# Patient Record
Sex: Female | Born: 1940
Health system: Southern US, Community
[De-identification: ages and names within clinical notes are randomized; demographics above are authoritative.]

## PROBLEM LIST (undated history)

## (undated) DIAGNOSIS — E039 Hypothyroidism, unspecified: Secondary | ICD-10-CM

## (undated) DIAGNOSIS — M109 Gout, unspecified: Secondary | ICD-10-CM

## (undated) DIAGNOSIS — E7800 Pure hypercholesterolemia, unspecified: Secondary | ICD-10-CM

## (undated) DIAGNOSIS — G4733 Obstructive sleep apnea (adult) (pediatric): Secondary | ICD-10-CM

## (undated) DIAGNOSIS — I10 Essential (primary) hypertension: Secondary | ICD-10-CM

## (undated) HISTORY — DX: Obstructive sleep apnea (adult) (pediatric): G47.33

---

## 1998-12-22 ENCOUNTER — Other Ambulatory Visit: Admission: RE | Admit: 1998-12-22 | Discharge: 1998-12-22 | Payer: Self-pay | Admitting: *Deleted

## 1999-08-20 ENCOUNTER — Emergency Department (HOSPITAL_COMMUNITY): Admission: EM | Admit: 1999-08-20 | Discharge: 1999-08-20 | Payer: Self-pay | Admitting: Emergency Medicine

## 1999-12-20 ENCOUNTER — Encounter: Payer: Self-pay | Admitting: *Deleted

## 1999-12-20 ENCOUNTER — Encounter: Admission: RE | Admit: 1999-12-20 | Discharge: 1999-12-20 | Payer: Self-pay | Admitting: *Deleted

## 2000-01-18 ENCOUNTER — Other Ambulatory Visit: Admission: RE | Admit: 2000-01-18 | Discharge: 2000-01-18 | Payer: Self-pay | Admitting: *Deleted

## 2000-12-21 ENCOUNTER — Encounter: Payer: Self-pay | Admitting: *Deleted

## 2000-12-21 ENCOUNTER — Encounter: Admission: RE | Admit: 2000-12-21 | Discharge: 2000-12-21 | Payer: Self-pay | Admitting: *Deleted

## 2000-12-25 ENCOUNTER — Encounter: Admission: RE | Admit: 2000-12-25 | Discharge: 2000-12-25 | Payer: Self-pay | Admitting: *Deleted

## 2000-12-25 ENCOUNTER — Encounter: Payer: Self-pay | Admitting: *Deleted

## 2002-01-07 ENCOUNTER — Encounter: Payer: Self-pay | Admitting: *Deleted

## 2002-01-07 ENCOUNTER — Encounter: Admission: RE | Admit: 2002-01-07 | Discharge: 2002-01-07 | Payer: Self-pay | Admitting: *Deleted

## 2002-01-24 ENCOUNTER — Other Ambulatory Visit: Admission: RE | Admit: 2002-01-24 | Discharge: 2002-01-24 | Payer: Self-pay | Admitting: *Deleted

## 2003-01-09 ENCOUNTER — Encounter: Admission: RE | Admit: 2003-01-09 | Discharge: 2003-01-09 | Payer: Self-pay | Admitting: *Deleted

## 2003-01-09 ENCOUNTER — Encounter: Payer: Self-pay | Admitting: *Deleted

## 2003-02-03 ENCOUNTER — Other Ambulatory Visit: Admission: RE | Admit: 2003-02-03 | Discharge: 2003-02-03 | Payer: Self-pay | Admitting: *Deleted

## 2004-01-12 ENCOUNTER — Encounter: Admission: RE | Admit: 2004-01-12 | Discharge: 2004-01-12 | Payer: Self-pay | Admitting: *Deleted

## 2004-02-03 ENCOUNTER — Other Ambulatory Visit: Admission: RE | Admit: 2004-02-03 | Discharge: 2004-02-03 | Payer: Self-pay | Admitting: *Deleted

## 2005-01-17 ENCOUNTER — Encounter: Admission: RE | Admit: 2005-01-17 | Discharge: 2005-01-17 | Payer: Self-pay | Admitting: *Deleted

## 2005-02-02 ENCOUNTER — Other Ambulatory Visit: Admission: RE | Admit: 2005-02-02 | Discharge: 2005-02-02 | Payer: Self-pay | Admitting: *Deleted

## 2005-09-21 ENCOUNTER — Ambulatory Visit (HOSPITAL_COMMUNITY): Admission: RE | Admit: 2005-09-21 | Discharge: 2005-09-21 | Payer: Self-pay | Admitting: *Deleted

## 2006-01-19 ENCOUNTER — Encounter: Admission: RE | Admit: 2006-01-19 | Discharge: 2006-01-19 | Payer: Self-pay | Admitting: *Deleted

## 2006-02-21 ENCOUNTER — Other Ambulatory Visit: Admission: RE | Admit: 2006-02-21 | Discharge: 2006-02-21 | Payer: Self-pay | Admitting: Gynecology

## 2007-02-12 ENCOUNTER — Encounter: Admission: RE | Admit: 2007-02-12 | Discharge: 2007-02-12 | Payer: Self-pay | Admitting: *Deleted

## 2007-03-12 ENCOUNTER — Other Ambulatory Visit: Admission: RE | Admit: 2007-03-12 | Discharge: 2007-03-12 | Payer: Self-pay | Admitting: *Deleted

## 2008-02-19 ENCOUNTER — Encounter: Admission: RE | Admit: 2008-02-19 | Discharge: 2008-02-19 | Payer: Self-pay | Admitting: Internal Medicine

## 2008-02-27 ENCOUNTER — Encounter: Admission: RE | Admit: 2008-02-27 | Discharge: 2008-02-27 | Payer: Self-pay | Admitting: Internal Medicine

## 2008-05-06 ENCOUNTER — Other Ambulatory Visit: Admission: RE | Admit: 2008-05-06 | Discharge: 2008-05-06 | Payer: Self-pay | Admitting: Gynecology

## 2009-02-26 ENCOUNTER — Encounter: Admission: RE | Admit: 2009-02-26 | Discharge: 2009-02-26 | Payer: Self-pay | Admitting: Internal Medicine

## 2009-03-03 ENCOUNTER — Encounter: Admission: RE | Admit: 2009-03-03 | Discharge: 2009-03-03 | Payer: Self-pay | Admitting: Internal Medicine

## 2010-03-17 ENCOUNTER — Encounter: Admission: RE | Admit: 2010-03-17 | Discharge: 2010-03-17 | Payer: Self-pay | Admitting: Internal Medicine

## 2010-08-08 ENCOUNTER — Encounter: Payer: Self-pay | Admitting: Internal Medicine

## 2010-12-03 NOTE — Op Note (Signed)
[name], [name]                 ACCOUNT NO.:  [id number]   MEDICAL RECORD NO.:  [id number]          PATIENT TYPE:  AMB   LOCATION:  ENDO                         FACILITY:  [facility]   PHYSICIAN:  Waverly Ferrari, M.D.    DATE OF BIRTH:  12-26-1940   DATE OF PROCEDURE:  09/21/2005  DATE OF DISCHARGE:                                 OPERATIVE REPORT   PROCEDURE:  Upper endoscopy.   INDICATIONS:  GERD.   ANESTHESIA:  Demerol 50 mg, Versed 5 mg.   PROCEDURE:  With the patient mildly sedated in the left lateral decubitus  position, the Olympus videoscopic endoscope was inserted in the mouth,  passed under direct vision through the esophagus, which appeared normal,  into the stomach.  The fundus, body, antrum, duodenal bulb and second  portion of the duodenum were visualized.  From this point the endoscope was  slowly withdrawn taking circumferential views of the duodenal mucosa until  the endoscope had been pulled back in the stomach, placed in retroflexion to  view the stomach from below, and the scope was straightened and withdrawn,  taking circumferential views of the remaining gastric and esophageal mucosa.  The patient's vital signs and pulse oximetry remained stable.  The patient  tolerated the procedure well without apparent complications.   FINDINGS:  Rather unremarkable examination.   PLAN:  Proceed to colonoscopy.           ______________________________  Waverly Ferrari, M.D.     GMO/MEDQ  D:  09/21/2005  T:  09/22/2005  Job:  [id number]

## 2010-12-03 NOTE — Op Note (Signed)
Brittany [name], Brittany [name]                 ACCOUNT NO.:  [id number]   MEDICAL RECORD NO.:  [id number]          PATIENT TYPE:  AMB   LOCATION:  ENDO                         FACILITY:  [facility]   PHYSICIAN:  Waverly Ferrari, M.D.    DATE OF BIRTH:  [birth year]   DATE OF PROCEDURE:  09/21/2005  DATE OF DISCHARGE:                                 OPERATIVE REPORT   PROCEDURE:  Colonoscopy.   INDICATIONS:  Colon polyps.   ANESTHESIA:  Demerol 20 mg, Versed 1 mg.   PROCEDURE:  With the patient mildly sedated in the left lateral decubitus  position, the Olympus videoscopic colonoscope was inserted to the rectum,  passed under direct vision to the cecum, identified by base of cecum and  ileocecal valve, both of which were photographed.  From this point the  colonoscope was slowly withdrawn taking circumferential views of colonic  mucosa, stopping only in the rectum, which appeared normal on direct and  showed hemorrhoids on retroflexed view.  The endoscope was straightened and  withdrawn.  The patient's vital signs, pulse oximeter remained stable.  The  patient tolerated the procedure well without apparent complications.   FINDINGS:  Internal hemorrhoids, otherwise an unremarkable colonoscopic  examination to the cecum.   PLAN:  Repeat examination possibly in five years if indicated.           ______________________________  Waverly Ferrari, M.D.     GMO/MEDQ  D:  09/21/2005  T:  09/22/2005  Job:  [id number]

## 2011-03-10 ENCOUNTER — Other Ambulatory Visit: Payer: Self-pay | Admitting: Internal Medicine

## 2011-03-10 DIAGNOSIS — Z1231 Encounter for screening mammogram for malignant neoplasm of breast: Secondary | ICD-10-CM

## 2011-03-24 ENCOUNTER — Ambulatory Visit
Admission: RE | Admit: 2011-03-24 | Discharge: 2011-03-24 | Disposition: A | Discharge status: Home / Self Care | Payer: Medicare Other | Source: Ambulatory Visit | Attending: Internal Medicine | Admitting: Internal Medicine

## 2011-03-24 DIAGNOSIS — Z1231 Encounter for screening mammogram for malignant neoplasm of breast: Secondary | ICD-10-CM

## 2011-11-08 ENCOUNTER — Other Ambulatory Visit: Payer: Self-pay | Admitting: Internal Medicine

## 2011-11-08 ENCOUNTER — Ambulatory Visit
Admission: RE | Admit: 2011-11-08 | Discharge: 2011-11-08 | Disposition: A | Discharge status: Home / Self Care | Payer: Medicare Other | Source: Ambulatory Visit | Attending: Internal Medicine | Admitting: Internal Medicine

## 2011-11-08 DIAGNOSIS — R000 Tachycardia, unspecified: Secondary | ICD-10-CM

## 2011-11-08 DIAGNOSIS — R9389 Abnormal findings on diagnostic imaging of other specified body structures: Secondary | ICD-10-CM

## 2011-11-08 MED ORDER — IOHEXOL 350 MG/ML SOLN
125.0000 mL | Freq: Once | INTRAVENOUS | Status: AC | PRN
  Administered 2011-11-08: 125 mL via INTRAVENOUS

## 2012-03-26 ENCOUNTER — Other Ambulatory Visit: Payer: Self-pay | Admitting: Internal Medicine

## 2012-03-26 DIAGNOSIS — Z1231 Encounter for screening mammogram for malignant neoplasm of breast: Secondary | ICD-10-CM

## 2012-04-10 ENCOUNTER — Ambulatory Visit
Admission: RE | Admit: 2012-04-10 | Discharge: 2012-04-10 | Disposition: A | Discharge status: Home / Self Care | Payer: Medicare Other | Source: Ambulatory Visit | Attending: Internal Medicine | Admitting: Internal Medicine

## 2012-04-10 DIAGNOSIS — Z1231 Encounter for screening mammogram for malignant neoplasm of breast: Secondary | ICD-10-CM

## 2013-03-26 ENCOUNTER — Other Ambulatory Visit: Payer: Self-pay

## 2013-03-26 DIAGNOSIS — Z1231 Encounter for screening mammogram for malignant neoplasm of breast: Secondary | ICD-10-CM

## 2013-04-18 ENCOUNTER — Ambulatory Visit
Admission: RE | Admit: 2013-04-18 | Discharge: 2013-04-18 | Disposition: A | Discharge status: Home / Self Care | Payer: Medicare Other | Source: Ambulatory Visit

## 2013-04-18 DIAGNOSIS — Z1231 Encounter for screening mammogram for malignant neoplasm of breast: Secondary | ICD-10-CM

## 2014-03-25 ENCOUNTER — Other Ambulatory Visit: Payer: Self-pay

## 2014-03-25 DIAGNOSIS — Z1231 Encounter for screening mammogram for malignant neoplasm of breast: Secondary | ICD-10-CM

## 2014-04-25 ENCOUNTER — Ambulatory Visit
Admission: RE | Admit: 2014-04-25 | Discharge: 2014-04-25 | Disposition: A | Discharge status: Home / Self Care | Payer: Medicare Other | Source: Ambulatory Visit

## 2014-04-25 DIAGNOSIS — Z1231 Encounter for screening mammogram for malignant neoplasm of breast: Secondary | ICD-10-CM

## 2014-08-05 DIAGNOSIS — M1000 Idiopathic gout, unspecified site: Secondary | ICD-10-CM | POA: Diagnosis not present

## 2014-08-05 DIAGNOSIS — I10 Essential (primary) hypertension: Secondary | ICD-10-CM | POA: Diagnosis not present

## 2014-08-12 DIAGNOSIS — L501 Idiopathic urticaria: Secondary | ICD-10-CM | POA: Diagnosis not present

## 2014-08-12 DIAGNOSIS — I10 Essential (primary) hypertension: Secondary | ICD-10-CM | POA: Diagnosis not present

## 2014-08-12 DIAGNOSIS — H539 Unspecified visual disturbance: Secondary | ICD-10-CM | POA: Diagnosis not present

## 2014-08-12 DIAGNOSIS — E782 Mixed hyperlipidemia: Secondary | ICD-10-CM | POA: Diagnosis not present

## 2014-11-12 DIAGNOSIS — L501 Idiopathic urticaria: Secondary | ICD-10-CM | POA: Diagnosis not present

## 2014-11-12 DIAGNOSIS — J300 Vasomotor rhinitis: Secondary | ICD-10-CM | POA: Diagnosis not present

## 2014-11-12 DIAGNOSIS — J309 Allergic rhinitis, unspecified: Secondary | ICD-10-CM | POA: Diagnosis not present

## 2015-01-05 DIAGNOSIS — H1013 Acute atopic conjunctivitis, bilateral: Secondary | ICD-10-CM | POA: Diagnosis not present

## 2015-01-12 DIAGNOSIS — M25569 Pain in unspecified knee: Secondary | ICD-10-CM | POA: Diagnosis not present

## 2015-01-22 DIAGNOSIS — M25569 Pain in unspecified knee: Secondary | ICD-10-CM | POA: Diagnosis not present

## 2015-01-28 DIAGNOSIS — E782 Mixed hyperlipidemia: Secondary | ICD-10-CM | POA: Diagnosis not present

## 2015-01-28 DIAGNOSIS — I10 Essential (primary) hypertension: Secondary | ICD-10-CM | POA: Diagnosis not present

## 2015-01-28 DIAGNOSIS — M150 Primary generalized (osteo)arthritis: Secondary | ICD-10-CM | POA: Diagnosis not present

## 2015-02-17 DIAGNOSIS — Z0000 Encounter for general adult medical examination without abnormal findings: Secondary | ICD-10-CM | POA: Diagnosis not present

## 2015-02-17 DIAGNOSIS — I10 Essential (primary) hypertension: Secondary | ICD-10-CM | POA: Diagnosis not present

## 2015-02-17 DIAGNOSIS — E782 Mixed hyperlipidemia: Secondary | ICD-10-CM | POA: Diagnosis not present

## 2015-02-17 DIAGNOSIS — Z780 Asymptomatic menopausal state: Secondary | ICD-10-CM | POA: Diagnosis not present

## 2015-02-17 DIAGNOSIS — E039 Hypothyroidism, unspecified: Secondary | ICD-10-CM | POA: Diagnosis not present

## 2015-03-12 ENCOUNTER — Other Ambulatory Visit: Payer: Self-pay

## 2015-03-12 DIAGNOSIS — Z1231 Encounter for screening mammogram for malignant neoplasm of breast: Secondary | ICD-10-CM

## 2015-04-01 DIAGNOSIS — Z23 Encounter for immunization: Secondary | ICD-10-CM | POA: Diagnosis not present

## 2015-04-27 ENCOUNTER — Ambulatory Visit: Payer: Self-pay

## 2015-05-04 ENCOUNTER — Ambulatory Visit
Admission: RE | Admit: 2015-05-04 | Discharge: 2015-05-04 | Disposition: A | Discharge status: Home / Self Care | Payer: Medicare Other | Source: Ambulatory Visit

## 2015-05-04 DIAGNOSIS — Z1231 Encounter for screening mammogram for malignant neoplasm of breast: Secondary | ICD-10-CM

## 2015-05-11 DIAGNOSIS — L501 Idiopathic urticaria: Secondary | ICD-10-CM | POA: Diagnosis not present

## 2015-05-12 DIAGNOSIS — F411 Generalized anxiety disorder: Secondary | ICD-10-CM | POA: Diagnosis not present

## 2015-05-12 DIAGNOSIS — L501 Idiopathic urticaria: Secondary | ICD-10-CM | POA: Diagnosis not present

## 2015-05-18 DIAGNOSIS — Z6837 Body mass index (BMI) 37.0-37.9, adult: Secondary | ICD-10-CM | POA: Diagnosis not present

## 2015-05-18 DIAGNOSIS — Z124 Encounter for screening for malignant neoplasm of cervix: Secondary | ICD-10-CM | POA: Diagnosis not present

## 2015-05-18 DIAGNOSIS — Z01419 Encounter for gynecological examination (general) (routine) without abnormal findings: Secondary | ICD-10-CM | POA: Diagnosis not present

## 2015-06-16 DIAGNOSIS — Z23 Encounter for immunization: Secondary | ICD-10-CM | POA: Diagnosis not present

## 2015-06-16 DIAGNOSIS — L501 Idiopathic urticaria: Secondary | ICD-10-CM | POA: Diagnosis not present

## 2015-06-17 DIAGNOSIS — J309 Allergic rhinitis, unspecified: Secondary | ICD-10-CM | POA: Diagnosis not present

## 2015-06-17 DIAGNOSIS — J300 Vasomotor rhinitis: Secondary | ICD-10-CM | POA: Diagnosis not present

## 2015-06-17 DIAGNOSIS — L501 Idiopathic urticaria: Secondary | ICD-10-CM | POA: Diagnosis not present

## 2015-08-11 DIAGNOSIS — M1A0790 Idiopathic chronic gout, unspecified ankle and foot, without tophus (tophi): Secondary | ICD-10-CM | POA: Diagnosis not present

## 2015-08-11 DIAGNOSIS — I10 Essential (primary) hypertension: Secondary | ICD-10-CM | POA: Diagnosis not present

## 2015-08-11 DIAGNOSIS — E782 Mixed hyperlipidemia: Secondary | ICD-10-CM | POA: Diagnosis not present

## 2015-08-18 DIAGNOSIS — I10 Essential (primary) hypertension: Secondary | ICD-10-CM | POA: Diagnosis not present

## 2015-08-18 DIAGNOSIS — E890 Postprocedural hypothyroidism: Secondary | ICD-10-CM | POA: Diagnosis not present

## 2015-08-18 DIAGNOSIS — E039 Hypothyroidism, unspecified: Secondary | ICD-10-CM | POA: Diagnosis not present

## 2015-08-18 DIAGNOSIS — E782 Mixed hyperlipidemia: Secondary | ICD-10-CM | POA: Diagnosis not present

## 2015-08-18 DIAGNOSIS — L501 Idiopathic urticaria: Secondary | ICD-10-CM | POA: Diagnosis not present

## 2015-09-02 DIAGNOSIS — E890 Postprocedural hypothyroidism: Secondary | ICD-10-CM | POA: Diagnosis not present

## 2015-09-30 DIAGNOSIS — I10 Essential (primary) hypertension: Secondary | ICD-10-CM | POA: Diagnosis not present

## 2015-09-30 DIAGNOSIS — E890 Postprocedural hypothyroidism: Secondary | ICD-10-CM | POA: Diagnosis not present

## 2015-09-30 DIAGNOSIS — R21 Rash and other nonspecific skin eruption: Secondary | ICD-10-CM | POA: Diagnosis not present

## 2015-11-12 DIAGNOSIS — E890 Postprocedural hypothyroidism: Secondary | ICD-10-CM | POA: Diagnosis not present

## 2015-11-12 DIAGNOSIS — L501 Idiopathic urticaria: Secondary | ICD-10-CM | POA: Diagnosis not present

## 2015-11-12 DIAGNOSIS — I10 Essential (primary) hypertension: Secondary | ICD-10-CM | POA: Diagnosis not present

## 2015-11-12 DIAGNOSIS — E782 Mixed hyperlipidemia: Secondary | ICD-10-CM | POA: Diagnosis not present

## 2015-11-19 ENCOUNTER — Emergency Department (HOSPITAL_COMMUNITY)
Admission: EM | Admit: 2015-11-19 | Discharge: 2015-11-19 | Disposition: A | Discharge status: Home / Self Care | Payer: Medicare Other | Attending: Emergency Medicine | Admitting: Emergency Medicine

## 2015-11-19 ENCOUNTER — Encounter (HOSPITAL_COMMUNITY): Payer: Self-pay | Admitting: Emergency Medicine

## 2015-11-19 DIAGNOSIS — R21 Rash and other nonspecific skin eruption: Secondary | ICD-10-CM | POA: Diagnosis present

## 2015-11-19 DIAGNOSIS — E7800 Pure hypercholesterolemia, unspecified: Secondary | ICD-10-CM | POA: Diagnosis not present

## 2015-11-19 DIAGNOSIS — L509 Urticaria, unspecified: Secondary | ICD-10-CM | POA: Diagnosis not present

## 2015-11-19 DIAGNOSIS — E039 Hypothyroidism, unspecified: Secondary | ICD-10-CM | POA: Insufficient documentation

## 2015-11-19 DIAGNOSIS — Z7982 Long term (current) use of aspirin: Secondary | ICD-10-CM | POA: Diagnosis not present

## 2015-11-19 DIAGNOSIS — I10 Essential (primary) hypertension: Secondary | ICD-10-CM | POA: Insufficient documentation

## 2015-11-19 DIAGNOSIS — Z79899 Other long term (current) drug therapy: Secondary | ICD-10-CM | POA: Insufficient documentation

## 2015-11-19 HISTORY — DX: Hypothyroidism, unspecified: E03.9

## 2015-11-19 HISTORY — DX: Essential (primary) hypertension: I10

## 2015-11-19 HISTORY — DX: Pure hypercholesterolemia, unspecified: E78.00

## 2015-11-19 HISTORY — DX: Gout, unspecified: M10.9

## 2015-11-19 MED ORDER — PREDNISONE 20 MG PO TABS
60.0000 mg | ORAL_TABLET | Freq: Once | ORAL | Status: AC
  Administered 2015-11-19: 60 mg via ORAL
  Filled 2015-11-19: qty 3

## 2015-11-19 MED ORDER — PREDNISONE 20 MG PO TABS: 40.0000 mg | ORAL_TABLET | Freq: Every day | ORAL | Status: DC

## 2015-11-19 MED ORDER — EPINEPHRINE 0.3 MG/0.3ML IJ SOAJ: 0.3000 mg | Freq: Once | INTRAMUSCULAR | Status: AC

## 2015-11-19 NOTE — ED Notes (Signed)
Pt c/o SOB and worsening of chronic red rash onset this morning on waking. Pt reports raised red rash over body x several years but that today it worsened and she felt she could not take a full inspiration. No contact with new substances, lotions, creams, or foods.

## 2015-11-19 NOTE — ED Provider Notes (Addendum)
CSN: [id number]     Arrival date & time 11/19/15  0911 History   First MD Initiated Contact with Patient 11/19/15 0930     Chief Complaint  Patient presents with  . Shortness of Breath  . Rash     (Consider location/radiation/quality/duration/timing/severity/associated sxs/prior Treatment) Patient is a 75 y.o. female presenting with rash. The history is provided by the patient.  Rash Location:  Full body Quality: itchiness and redness   Severity:  Severe Onset quality:  Gradual Duration:  12 hours Timing:  Constant Progression:  Worsening Chronicity:  Chronic Relieved by:  Nothing Worsened by:  Nothing tried Ineffective treatments: allegra. Associated symptoms: no throat swelling, no tongue swelling and not wheezing   Associated symptoms comment:  States this morning when she was in the shower when the hives were really bad she felt like maybe she had to take deeper breaths but denies any throat itchiness, scratching and states currently her breathing is normal.   Past Medical History  Diagnosis Date  . Hypertension   . Hypothyroid   . Gout   . Hypercholesterolemia    History reviewed. No pertinent past surgical history. History reviewed. No pertinent family history. Social History  Substance Use Topics  . Smoking status: Former Research scientist (life sciences)  . Smokeless tobacco: None  . Alcohol Use: None   OB History    No data available     Review of Systems  Respiratory: Negative for wheezing.   Skin: Positive for rash.  All other systems reviewed and are negative.     Allergies  Review of patient's allergies indicates no known allergies.  Home Medications   Prior to Admission medications   Medication Sig Start Date End Date Taking? Authorizing Provider  allopurinol (ZYLOPRIM) 100 MG tablet Take 100 mg by mouth daily.   Yes Historical Provider, MD  aspirin EC 81 MG tablet Take 81 mg by mouth daily.   Yes Historical Provider, MD  atorvastatin (LIPITOR) 20 MG tablet Take 20  mg by mouth daily.   Yes Historical Provider, MD  colchicine 0.6 MG tablet Take 0.6 mg by mouth daily as needed (for gout flare ups).   Yes Historical Provider, MD  diltiazem (DILACOR XR) 120 MG 24 hr capsule Take 120 mg by mouth daily.   Yes Historical Provider, MD  diphenhydrAMINE (BENADRYL) 25 mg capsule Take 25 mg by mouth 2 (two) times daily as needed for itching.   Yes Historical Provider, MD  fexofenadine (ALLEGRA) 180 MG tablet Take 180 mg by mouth daily as needed for allergies.   Yes Historical Provider, MD  furosemide (LASIX) 40 MG tablet Take 40 mg by mouth daily.   Yes Historical Provider, MD  guanFACINE (TENEX) 1 MG tablet Take 2 mg by mouth at bedtime.   Yes Historical Provider, MD  hydrOXYzine (ATARAX/VISTARIL) 25 MG tablet Take 2 tablets by mouth 3 (three) times daily as needed for itching.  09/02/15  Yes Historical Provider, MD  levothyroxine (SYNTHROID, LEVOTHROID) 150 MCG tablet Take 150 mcg by mouth daily before breakfast.   Yes Historical Provider, MD  montelukast (SINGULAIR) 10 MG tablet Take 10 mg by mouth daily as needed (for alleriges).  10/28/15  Yes Historical Provider, MD   BP 136/80 mmHg  Pulse 87  Temp(Src)   Resp 16  SpO2 100% Physical Exam  Constitutional: She is oriented to person, place, and time. She appears well-developed and well-nourished. No distress.  HENT:  Head: Normocephalic and atraumatic.  Mouth/Throat: Oropharynx is clear and moist. No  posterior oropharyngeal edema or posterior oropharyngeal erythema.  Eyes: Conjunctivae and EOM are normal. Pupils are equal, round, and reactive to light.  Neck: Normal range of motion. Neck supple.  Cardiovascular: Normal rate, regular rhythm and intact distal pulses.   No murmur heard. Pulmonary/Chest: Effort normal and breath sounds normal. No respiratory distress. She has no wheezes. She has no rales.  Abdominal: Soft.  Musculoskeletal: Normal range of motion. She exhibits no edema or tenderness.   Neurological: She is alert and oriented to person, place, and time.  Skin: Skin is warm and dry. Rash noted. Rash is urticarial. No erythema.  Generalized urticarial rash over the entire body.  No mucous membrane involvement  Psychiatric: She has a normal mood and affect. Her behavior is normal.  Nursing note and vitals reviewed.   ED Course  Procedures (including critical care time) Labs Review Labs Reviewed - No data to display  Imaging Review No results found. I have personally reviewed and evaluated these images and lab results as part of my medical decision-making.   EKG Interpretation None      MDM   Final diagnoses:  Urticaria    Patient is a 75 year old female with a history of idiopathic hives for the last 3 years who states that a proximally 6 weeks ago she was put on a new blood pressure medication and her hives. For a month however now they started to come back. She did stop taking a lot of her allergy medication however now the hives are the worst they've ever been. She did take it later today and an Allegra but states the hives are still very bothersome. When she was in the shower she thought she felt a little short of breath but that has resolved and she denies any scratchiness of her throat, itching in her mouth or swelling. Patient's oxygen saturation is 100%. Patient given prednisone here and instructed to start back all of her antihistamine medications. She has an allergist and an endocrinologist that she can follow-up with. She was given a Dosepak of prednisone. She was also prescribed an EpiPen and instructed to use it only if she is having difficulty breathing or her throat swelling shut.   Blanchie Dessert, MD 11/19/15 1008  Blanchie Dessert, MD 11/19/15 1010

## 2015-11-19 NOTE — Discharge Instructions (Signed)
Hives Hives are itchy, red, swollen areas of the skin. They can vary in size and location on your body. Hives can come and go for hours or several days (acute hives) or for several weeks (chronic hives). Hives do not spread from person to person (noncontagious). They may get worse with scratching, exercise, and emotional stress. CAUSES   Allergic reaction to food, additives, or drugs.  Infections, including the common cold.  Illness, such as vasculitis, lupus, or thyroid disease.  Exposure to sunlight, heat, or cold.  Exercise.  Stress.  Contact with chemicals. SYMPTOMS   Red or white swollen patches on the skin. The patches may change size, shape, and location quickly and repeatedly.  Itching.  Swelling of the hands, feet, and face. This may occur if hives develop deeper in the skin. DIAGNOSIS  Your caregiver can usually tell what is wrong by performing a physical exam. Skin or blood tests may also be done to determine the cause of your hives. In some cases, the cause cannot be determined. TREATMENT  Mild cases usually get better with medicines such as antihistamines. Severe cases may require an emergency epinephrine injection. If the cause of your hives is known, treatment includes avoiding that trigger.  HOME CARE INSTRUCTIONS   Avoid causes that trigger your hives.  Take antihistamines as directed by your caregiver to reduce the severity of your hives. Non-sedating or low-sedating antihistamines are usually recommended. Do not drive while taking an antihistamine.  Take any other medicines prescribed for itching as directed by your caregiver.  Wear loose-fitting clothing.  Keep all follow-up appointments as directed by your caregiver. SEEK MEDICAL CARE IF:   You have persistent or severe itching that is not relieved with medicine.  You have painful or swollen joints. SEEK IMMEDIATE MEDICAL CARE IF:   You have a fever.  Your tongue or lips are swollen.  You have  trouble breathing or swallowing.  You feel tightness in the throat or chest.  You have abdominal pain. These problems may be the first sign of a life-threatening allergic reaction. Call your local emergency services (911 in [place] MAKE SURE YOU:   Understand these instructions.  Will watch your condition.  Will get help right away if you are not doing well or get worse.   This information is not intended to replace advice given to you by your health care provider. Make sure you discuss any questions you have with your health care provider.   Document Released: 07/04/2005 Document Revised: 07/09/2013 Document Reviewed: 09/27/2011 Elsevier Interactive Patient Education 2016 Else[employer] Inc.  

## 2015-12-28 DIAGNOSIS — L501 Idiopathic urticaria: Secondary | ICD-10-CM | POA: Diagnosis not present

## 2015-12-28 DIAGNOSIS — J300 Vasomotor rhinitis: Secondary | ICD-10-CM | POA: Diagnosis not present

## 2016-01-07 DIAGNOSIS — H26493 Other secondary cataract, bilateral: Secondary | ICD-10-CM | POA: Diagnosis not present

## 2016-01-12 DIAGNOSIS — R0602 Shortness of breath: Secondary | ICD-10-CM | POA: Diagnosis not present

## 2016-01-12 DIAGNOSIS — R600 Localized edema: Secondary | ICD-10-CM | POA: Diagnosis not present

## 2016-01-13 ENCOUNTER — Other Ambulatory Visit (HOSPITAL_COMMUNITY): Payer: Self-pay | Admitting: Respiratory Therapy

## 2016-01-13 DIAGNOSIS — R0602 Shortness of breath: Secondary | ICD-10-CM

## 2016-01-18 ENCOUNTER — Ambulatory Visit (HOSPITAL_COMMUNITY)
Admission: RE | Admit: 2016-01-18 | Discharge: 2016-01-18 | Disposition: A | Discharge status: Home / Self Care | Payer: Medicare Other | Source: Ambulatory Visit | Attending: Internal Medicine | Admitting: Internal Medicine

## 2016-01-18 DIAGNOSIS — R0602 Shortness of breath: Secondary | ICD-10-CM | POA: Diagnosis not present

## 2016-01-18 MED ORDER — ALBUTEROL SULFATE (2.5 MG/3ML) 0.083% IN NEBU
2.5000 mg | INHALATION_SOLUTION | Freq: Once | RESPIRATORY_TRACT | Status: AC
  Administered 2016-01-18: 2.5 mg via RESPIRATORY_TRACT

## 2016-01-20 LAB — PULMONARY FUNCTION TEST
DL/VA % pred: 52 %
DL/VA: 2.69 ml/min/mmHg/L
DLCO unc % pred: 39 %
DLCO unc: 11.24 ml/min/mmHg
FEF 25-75 Post: 1.72 L/sec
FEF 25-75 Pre: 2 L/sec
FEF2575-%Change-Post: -13 %
FEF2575-%Pred-Post: 101 %
FEF2575-%Pred-Pre: 118 %
FEV1-%Change-Post: 0 %
FEV1-%Pred-Post: 101 %
FEV1-%Pred-Pre: 102 %
FEV1-Post: 1.99 L
FEV1-Pre: 2.01 L
FEV1FVC-%Change-Post: -4 %
FEV1FVC-%Pred-Pre: 105 %
FEV6-%Change-Post: 2 %
FEV6-%Pred-Post: 104 %
FEV6-%Pred-Pre: 102 %
FEV6-Post: 2.55 L
FEV6-Pre: 2.49 L
FEV6FVC-%Change-Post: 0 %
FEV6FVC-%Pred-Post: 104 %
FEV6FVC-%Pred-Pre: 103 %
FVC-%Change-Post: 3 %
FVC-%Pred-Post: 102 %
FVC-%Pred-Pre: 99 %
FVC-Post: 2.59 L
FVC-Pre: 2.5 L
Post FEV1/FVC ratio: 77 %
Post FEV6/FVC ratio: 100 %
Pre FEV1/FVC ratio: 80 %
Pre FEV6/FVC Ratio: 100 %
RV % pred: 68 %
RV: 1.67 L
TLC % pred: 81 %
TLC: 4.46 L

## 2016-01-21 DIAGNOSIS — R0602 Shortness of breath: Secondary | ICD-10-CM | POA: Diagnosis not present

## 2016-01-21 DIAGNOSIS — R600 Localized edema: Secondary | ICD-10-CM | POA: Diagnosis not present

## 2016-01-28 DIAGNOSIS — R600 Localized edema: Secondary | ICD-10-CM | POA: Diagnosis not present

## 2016-02-01 DIAGNOSIS — E782 Mixed hyperlipidemia: Secondary | ICD-10-CM | POA: Diagnosis not present

## 2016-02-01 DIAGNOSIS — R600 Localized edema: Secondary | ICD-10-CM | POA: Diagnosis not present

## 2016-02-02 DIAGNOSIS — R609 Edema, unspecified: Secondary | ICD-10-CM | POA: Diagnosis not present

## 2016-02-02 DIAGNOSIS — R0602 Shortness of breath: Secondary | ICD-10-CM | POA: Diagnosis not present

## 2016-02-02 DIAGNOSIS — I509 Heart failure, unspecified: Secondary | ICD-10-CM | POA: Diagnosis not present

## 2016-02-22 DIAGNOSIS — R600 Localized edema: Secondary | ICD-10-CM | POA: Diagnosis not present

## 2016-02-26 DIAGNOSIS — R600 Localized edema: Secondary | ICD-10-CM | POA: Diagnosis not present

## 2016-02-26 DIAGNOSIS — I10 Essential (primary) hypertension: Secondary | ICD-10-CM | POA: Diagnosis not present

## 2016-02-26 DIAGNOSIS — E782 Mixed hyperlipidemia: Secondary | ICD-10-CM | POA: Diagnosis not present

## 2016-03-31 DIAGNOSIS — E782 Mixed hyperlipidemia: Secondary | ICD-10-CM | POA: Diagnosis not present

## 2016-03-31 DIAGNOSIS — E890 Postprocedural hypothyroidism: Secondary | ICD-10-CM | POA: Diagnosis not present

## 2016-03-31 DIAGNOSIS — Z23 Encounter for immunization: Secondary | ICD-10-CM | POA: Diagnosis not present

## 2016-03-31 DIAGNOSIS — Z0000 Encounter for general adult medical examination without abnormal findings: Secondary | ICD-10-CM | POA: Diagnosis not present

## 2016-03-31 DIAGNOSIS — I10 Essential (primary) hypertension: Secondary | ICD-10-CM | POA: Diagnosis not present

## 2016-04-05 ENCOUNTER — Other Ambulatory Visit: Payer: Self-pay | Admitting: Internal Medicine

## 2016-04-05 DIAGNOSIS — Z1231 Encounter for screening mammogram for malignant neoplasm of breast: Secondary | ICD-10-CM

## 2016-04-07 DIAGNOSIS — L501 Idiopathic urticaria: Secondary | ICD-10-CM | POA: Diagnosis not present

## 2016-04-07 DIAGNOSIS — R600 Localized edema: Secondary | ICD-10-CM | POA: Diagnosis not present

## 2016-04-07 DIAGNOSIS — E782 Mixed hyperlipidemia: Secondary | ICD-10-CM | POA: Diagnosis not present

## 2016-04-07 DIAGNOSIS — M150 Primary generalized (osteo)arthritis: Secondary | ICD-10-CM | POA: Diagnosis not present

## 2016-04-30 DIAGNOSIS — L500 Allergic urticaria: Secondary | ICD-10-CM | POA: Diagnosis not present

## 2016-05-10 ENCOUNTER — Ambulatory Visit
Admission: RE | Admit: 2016-05-10 | Discharge: 2016-05-10 | Disposition: A | Discharge status: Home / Self Care | Payer: Medicare Other | Source: Ambulatory Visit | Attending: Internal Medicine | Admitting: Internal Medicine

## 2016-05-10 DIAGNOSIS — Z1231 Encounter for screening mammogram for malignant neoplasm of breast: Secondary | ICD-10-CM | POA: Diagnosis not present

## 2016-05-11 DIAGNOSIS — J300 Vasomotor rhinitis: Secondary | ICD-10-CM | POA: Diagnosis not present

## 2016-05-11 DIAGNOSIS — L501 Idiopathic urticaria: Secondary | ICD-10-CM | POA: Diagnosis not present

## 2016-05-13 DIAGNOSIS — L501 Idiopathic urticaria: Secondary | ICD-10-CM | POA: Diagnosis not present

## 2016-06-14 DIAGNOSIS — Z01419 Encounter for gynecological examination (general) (routine) without abnormal findings: Secondary | ICD-10-CM | POA: Diagnosis not present

## 2016-06-15 DIAGNOSIS — L501 Idiopathic urticaria: Secondary | ICD-10-CM | POA: Diagnosis not present

## 2016-06-29 DIAGNOSIS — L501 Idiopathic urticaria: Secondary | ICD-10-CM | POA: Diagnosis not present

## 2016-06-29 DIAGNOSIS — J300 Vasomotor rhinitis: Secondary | ICD-10-CM | POA: Diagnosis not present

## 2016-07-15 DIAGNOSIS — L501 Idiopathic urticaria: Secondary | ICD-10-CM | POA: Diagnosis not present

## 2016-08-12 DIAGNOSIS — L501 Idiopathic urticaria: Secondary | ICD-10-CM | POA: Diagnosis not present

## 2016-09-12 DIAGNOSIS — L501 Idiopathic urticaria: Secondary | ICD-10-CM | POA: Diagnosis not present

## 2016-10-10 DIAGNOSIS — L501 Idiopathic urticaria: Secondary | ICD-10-CM | POA: Diagnosis not present

## 2016-11-04 DIAGNOSIS — M1A0790 Idiopathic chronic gout, unspecified ankle and foot, without tophus (tophi): Secondary | ICD-10-CM | POA: Diagnosis not present

## 2016-11-04 DIAGNOSIS — E782 Mixed hyperlipidemia: Secondary | ICD-10-CM | POA: Diagnosis not present

## 2016-11-10 DIAGNOSIS — L501 Idiopathic urticaria: Secondary | ICD-10-CM | POA: Diagnosis not present

## 2016-11-14 DIAGNOSIS — M1A0790 Idiopathic chronic gout, unspecified ankle and foot, without tophus (tophi): Secondary | ICD-10-CM | POA: Diagnosis not present

## 2016-11-14 DIAGNOSIS — M150 Primary generalized (osteo)arthritis: Secondary | ICD-10-CM | POA: Diagnosis not present

## 2016-11-14 DIAGNOSIS — E782 Mixed hyperlipidemia: Secondary | ICD-10-CM | POA: Diagnosis not present

## 2016-11-14 DIAGNOSIS — I129 Hypertensive chronic kidney disease with stage 1 through stage 4 chronic kidney disease, or unspecified chronic kidney disease: Secondary | ICD-10-CM | POA: Diagnosis not present

## 2016-12-15 DIAGNOSIS — L501 Idiopathic urticaria: Secondary | ICD-10-CM | POA: Diagnosis not present

## 2016-12-29 DIAGNOSIS — K2970 Gastritis, unspecified, without bleeding: Secondary | ICD-10-CM | POA: Diagnosis not present

## 2016-12-29 DIAGNOSIS — M150 Primary generalized (osteo)arthritis: Secondary | ICD-10-CM | POA: Diagnosis not present

## 2016-12-29 DIAGNOSIS — E890 Postprocedural hypothyroidism: Secondary | ICD-10-CM | POA: Diagnosis not present

## 2016-12-29 DIAGNOSIS — E782 Mixed hyperlipidemia: Secondary | ICD-10-CM | POA: Diagnosis not present

## 2017-01-12 DIAGNOSIS — L501 Idiopathic urticaria: Secondary | ICD-10-CM | POA: Diagnosis not present

## 2017-01-16 DIAGNOSIS — L501 Idiopathic urticaria: Secondary | ICD-10-CM | POA: Diagnosis not present

## 2017-01-16 DIAGNOSIS — J300 Vasomotor rhinitis: Secondary | ICD-10-CM | POA: Diagnosis not present

## 2017-01-17 DIAGNOSIS — H26493 Other secondary cataract, bilateral: Secondary | ICD-10-CM | POA: Diagnosis not present

## 2017-02-10 DIAGNOSIS — L501 Idiopathic urticaria: Secondary | ICD-10-CM | POA: Diagnosis not present

## 2017-03-13 DIAGNOSIS — L501 Idiopathic urticaria: Secondary | ICD-10-CM | POA: Diagnosis not present

## 2017-04-20 ENCOUNTER — Other Ambulatory Visit: Payer: Self-pay | Admitting: Internal Medicine

## 2017-04-20 DIAGNOSIS — Z1231 Encounter for screening mammogram for malignant neoplasm of breast: Secondary | ICD-10-CM

## 2017-04-20 DIAGNOSIS — L501 Idiopathic urticaria: Secondary | ICD-10-CM | POA: Diagnosis not present

## 2017-04-25 DIAGNOSIS — E782 Mixed hyperlipidemia: Secondary | ICD-10-CM | POA: Diagnosis not present

## 2017-04-25 DIAGNOSIS — Z23 Encounter for immunization: Secondary | ICD-10-CM | POA: Diagnosis not present

## 2017-04-25 DIAGNOSIS — Z780 Asymptomatic menopausal state: Secondary | ICD-10-CM | POA: Diagnosis not present

## 2017-04-25 DIAGNOSIS — I10 Essential (primary) hypertension: Secondary | ICD-10-CM | POA: Diagnosis not present

## 2017-04-25 DIAGNOSIS — I129 Hypertensive chronic kidney disease with stage 1 through stage 4 chronic kidney disease, or unspecified chronic kidney disease: Secondary | ICD-10-CM | POA: Diagnosis not present

## 2017-04-25 DIAGNOSIS — Z0000 Encounter for general adult medical examination without abnormal findings: Secondary | ICD-10-CM | POA: Diagnosis not present

## 2017-04-25 DIAGNOSIS — E890 Postprocedural hypothyroidism: Secondary | ICD-10-CM | POA: Diagnosis not present

## 2017-05-01 DIAGNOSIS — L501 Idiopathic urticaria: Secondary | ICD-10-CM | POA: Diagnosis not present

## 2017-05-01 DIAGNOSIS — I129 Hypertensive chronic kidney disease with stage 1 through stage 4 chronic kidney disease, or unspecified chronic kidney disease: Secondary | ICD-10-CM | POA: Diagnosis not present

## 2017-05-01 DIAGNOSIS — E890 Postprocedural hypothyroidism: Secondary | ICD-10-CM | POA: Diagnosis not present

## 2017-05-01 DIAGNOSIS — N183 Chronic kidney disease, stage 3 (moderate): Secondary | ICD-10-CM | POA: Diagnosis not present

## 2017-05-01 DIAGNOSIS — M1A0790 Idiopathic chronic gout, unspecified ankle and foot, without tophus (tophi): Secondary | ICD-10-CM | POA: Diagnosis not present

## 2017-05-01 DIAGNOSIS — R600 Localized edema: Secondary | ICD-10-CM | POA: Diagnosis not present

## 2017-05-01 DIAGNOSIS — E782 Mixed hyperlipidemia: Secondary | ICD-10-CM | POA: Diagnosis not present

## 2017-05-01 DIAGNOSIS — M150 Primary generalized (osteo)arthritis: Secondary | ICD-10-CM | POA: Diagnosis not present

## 2017-05-18 DIAGNOSIS — L501 Idiopathic urticaria: Secondary | ICD-10-CM | POA: Diagnosis not present

## 2017-05-19 ENCOUNTER — Ambulatory Visit
Admission: RE | Admit: 2017-05-19 | Discharge: 2017-05-19 | Disposition: A | Discharge status: Home / Self Care | Payer: Medicare Other | Source: Ambulatory Visit | Attending: Internal Medicine | Admitting: Internal Medicine

## 2017-05-19 DIAGNOSIS — Z1231 Encounter for screening mammogram for malignant neoplasm of breast: Secondary | ICD-10-CM

## 2017-06-23 DIAGNOSIS — L501 Idiopathic urticaria: Secondary | ICD-10-CM | POA: Diagnosis not present

## 2017-07-20 DIAGNOSIS — Z6838 Body mass index (BMI) 38.0-38.9, adult: Secondary | ICD-10-CM | POA: Diagnosis not present

## 2017-07-20 DIAGNOSIS — Z124 Encounter for screening for malignant neoplasm of cervix: Secondary | ICD-10-CM | POA: Diagnosis not present

## 2017-07-24 DIAGNOSIS — E890 Postprocedural hypothyroidism: Secondary | ICD-10-CM | POA: Diagnosis not present

## 2017-07-25 DIAGNOSIS — L501 Idiopathic urticaria: Secondary | ICD-10-CM | POA: Diagnosis not present

## 2017-08-24 DIAGNOSIS — L501 Idiopathic urticaria: Secondary | ICD-10-CM | POA: Diagnosis not present

## 2017-09-21 DIAGNOSIS — L501 Idiopathic urticaria: Secondary | ICD-10-CM | POA: Diagnosis not present

## 2017-10-18 DIAGNOSIS — L501 Idiopathic urticaria: Secondary | ICD-10-CM | POA: Diagnosis not present

## 2017-11-01 DIAGNOSIS — E782 Mixed hyperlipidemia: Secondary | ICD-10-CM | POA: Diagnosis not present

## 2017-11-13 DIAGNOSIS — L501 Idiopathic urticaria: Secondary | ICD-10-CM | POA: Diagnosis not present

## 2017-11-13 DIAGNOSIS — M150 Primary generalized (osteo)arthritis: Secondary | ICD-10-CM | POA: Diagnosis not present

## 2017-11-13 DIAGNOSIS — E890 Postprocedural hypothyroidism: Secondary | ICD-10-CM | POA: Diagnosis not present

## 2017-11-13 DIAGNOSIS — M1A0790 Idiopathic chronic gout, unspecified ankle and foot, without tophus (tophi): Secondary | ICD-10-CM | POA: Diagnosis not present

## 2017-11-13 DIAGNOSIS — E782 Mixed hyperlipidemia: Secondary | ICD-10-CM | POA: Diagnosis not present

## 2017-11-16 DIAGNOSIS — L501 Idiopathic urticaria: Secondary | ICD-10-CM | POA: Diagnosis not present

## 2017-12-19 DIAGNOSIS — H26492 Other secondary cataract, left eye: Secondary | ICD-10-CM | POA: Diagnosis not present

## 2017-12-20 DIAGNOSIS — L501 Idiopathic urticaria: Secondary | ICD-10-CM | POA: Diagnosis not present

## 2017-12-25 DIAGNOSIS — H26492 Other secondary cataract, left eye: Secondary | ICD-10-CM | POA: Diagnosis not present

## 2018-01-15 DIAGNOSIS — L501 Idiopathic urticaria: Secondary | ICD-10-CM | POA: Diagnosis not present

## 2018-02-15 DIAGNOSIS — L501 Idiopathic urticaria: Secondary | ICD-10-CM | POA: Diagnosis not present

## 2018-03-08 ENCOUNTER — Encounter (HOSPITAL_COMMUNITY): Payer: Self-pay | Admitting: *Deleted

## 2018-03-08 ENCOUNTER — Other Ambulatory Visit: Payer: Self-pay

## 2018-03-08 ENCOUNTER — Telehealth: Payer: Self-pay | Admitting: *Deleted

## 2018-03-08 ENCOUNTER — Emergency Department (HOSPITAL_COMMUNITY)
Admission: EM | Admit: 2018-03-08 | Discharge: 2018-03-08 | Disposition: A | Discharge status: Home / Self Care | Payer: Medicare Other | Attending: Emergency Medicine | Admitting: Emergency Medicine

## 2018-03-08 DIAGNOSIS — E039 Hypothyroidism, unspecified: Secondary | ICD-10-CM | POA: Diagnosis not present

## 2018-03-08 DIAGNOSIS — L089 Local infection of the skin and subcutaneous tissue, unspecified: Secondary | ICD-10-CM | POA: Insufficient documentation

## 2018-03-08 DIAGNOSIS — Z79899 Other long term (current) drug therapy: Secondary | ICD-10-CM | POA: Insufficient documentation

## 2018-03-08 DIAGNOSIS — I10 Essential (primary) hypertension: Secondary | ICD-10-CM | POA: Insufficient documentation

## 2018-03-08 DIAGNOSIS — Z7982 Long term (current) use of aspirin: Secondary | ICD-10-CM | POA: Diagnosis not present

## 2018-03-08 DIAGNOSIS — Z87891 Personal history of nicotine dependence: Secondary | ICD-10-CM | POA: Diagnosis not present

## 2018-03-08 DIAGNOSIS — S80862A Insect bite (nonvenomous), left lower leg, initial encounter: Secondary | ICD-10-CM | POA: Diagnosis not present

## 2018-03-08 DIAGNOSIS — W57XXXA Bitten or stung by nonvenomous insect and other nonvenomous arthropods, initial encounter: Secondary | ICD-10-CM

## 2018-03-08 MED ORDER — HYDROXYZINE HCL 25 MG PO TABS
25.0000 mg | ORAL_TABLET | Freq: Once | ORAL | Status: AC
  Administered 2018-03-08: 25 mg via ORAL
  Filled 2018-03-08: qty 1

## 2018-03-08 MED ORDER — HYDROXYZINE HCL 25 MG PO TABS: 25.0000 mg | ORAL_TABLET | Freq: Three times a day (TID) | ORAL | 0 refills | Status: DC | PRN

## 2018-03-08 MED ORDER — CEPHALEXIN 500 MG PO CAPS: 500.0000 mg | ORAL_CAPSULE | Freq: Four times a day (QID) | ORAL | 0 refills | Status: DC

## 2018-03-08 NOTE — ED Provider Notes (Signed)
[facility] EMERGENCY DEPARTMENT Provider Note   CSN: [id number] Arrival date & time: 03/08/18  0408     History   Chief Complaint Chief Complaint  Patient presents with  . Insect Bite    HPI Brittany [name] is a 77 y.o. female.  The history is provided by medical records. No language interpreter was used.     77 year old female with history of thyroid disease, hypertension, hypercholesterolemia and gout presenting for evaluation of skin rash.  Patient mention she was staying with her husband upstairs in the inpatient unit waiting for a coronary bypass.  She has been staying there for the past several nights.  This morning she noticed redness and itchiness involving her left lower extremities that felt like she was bitten by some type of insect.  It is minimally tender but mostly itchy.  It is red and warm to the touch according to patient.  She tries hydrocortisone cream this morning without relief.  She did not recall anything specifically bitten her but she was concern for potential bedbugs.  She did have history of chronic hives in which she takes Xolair for, but felt that this is different.  She denies any other environmental changes aside from staying upstairs.  No complaint of fever, chills, headache, chest pain, trouble breathing, abdominal cramping.  Past Medical History:  Diagnosis Date  . Gout   . Hypercholesterolemia   . Hypertension   . Hypothyroid     There are no active problems to display for this patient.   History reviewed. No pertinent surgical history.   OB History   None      Home Medications    Prior to Admission medications   Medication Sig Start Date End Date Taking? Authorizing Provider  allopurinol (ZYLOPRIM) 100 MG tablet Take 100 mg by mouth daily.    [provider]  aspirin EC 81 MG tablet Take 81 mg by mouth daily.    [provider]  atorvastatin (LIPITOR) 20 MG tablet Take 20 mg by mouth daily.     [provider]  colchicine 0.6 MG tablet Take 0.6 mg by mouth daily as needed (for gout flare ups).    [provider]  diltiazem (DILACOR XR) 120 MG 24 hr capsule Take 120 mg by mouth daily.    [provider]  diphenhydrAMINE (BENADRYL) 25 mg capsule Take 25 mg by mouth 2 (two) times daily as needed for itching.    [provider]  EPINEPHrine (EPIPEN 2-PAK) 0.3 mg/0.3 mL IJ SOAJ injection Inject 0.3 mLs (0.3 mg total) into the muscle once. 11/19/15   Blanchie Dessert, MD  fexofenadine (ALLEGRA) 180 MG tablet Take 180 mg by mouth daily as needed for allergies.    [provider]  furosemide (LASIX) 40 MG tablet Take 40 mg by mouth daily.    [provider]  guanFACINE (TENEX) 1 MG tablet Take 2 mg by mouth at bedtime.    [provider]  hydrOXYzine (ATARAX/VISTARIL) 25 MG tablet Take 2 tablets by mouth 3 (three) times daily as needed for itching.  09/02/15   [provider]  levothyroxine (SYNTHROID, LEVOTHROID) 150 MCG tablet Take 150 mcg by mouth daily before breakfast.    [provider]  montelukast (SINGULAIR) 10 MG tablet Take 10 mg by mouth daily as needed (for alleriges).  10/28/15   [provider]  predniSONE (DELTASONE) 20 MG tablet Take 2 tablets (40 mg total) by mouth daily. 11/19/15  Blanchie Dessert, MD    Family History Family History  Problem Relation Age of Onset  . Breast cancer Neg Hx     Social History Social History   Tobacco Use  . Smoking status: Former Research scientist (life sciences)  . Smokeless tobacco: Never Used  Substance Use Topics  . Alcohol use: Never    Frequency: Never  . Drug use: Not on file     Allergies   Patient has no known allergies.   Review of Systems Review of Systems  All other systems reviewed and are negative.    Physical Exam Updated Vital Signs BP (!) 156/79   Pulse 80   Temp 98.4 F (36.9 C)   Resp 18   Ht 5' 7.5" (1.715 m)   Wt 113.4 kg   SpO2 98%    BMI 38.58 kg/m   Physical Exam  Constitutional: She appears well-developed and well-nourished. No distress.  Patient is well-appearing  HENT:  Head: Atraumatic.  Eyes: Conjunctivae are normal.  Neck: Neck supple.  Cardiovascular: Normal rate and regular rhythm.  Pulmonary/Chest: Effort normal and breath sounds normal.  Neurological: She is alert.  Skin: Rash (Multiple confluent erythematous patches of various size noted to left lower extremity with mild localized induration with warmth.  No significant tenderness noted.  No peripheral edema.  Dorsalis pedis pulse palpable.) noted.  Psychiatric: She has a normal mood and affect.  Nursing note and vitals reviewed.    ED Treatments / Results  Labs (all labs ordered are listed, but only abnormal results are displayed) Labs Reviewed - No data to display  EKG None  Radiology No results found.  Procedures Procedures (including critical care time)  Medications Ordered in ED Medications - No data to display   Initial Impression / Assessment and Plan / ED Course  I have reviewed the triage vital signs and the nursing notes.  Pertinent labs & imaging results that were available during my care of the patient were reviewed by me and considered in my medical decision making (see chart for details).     BP (!) 156/79   Pulse 80   Temp 98.4 F (36.9 C)   Resp 18   Ht 5' 7.5" (1.715 m)   Wt 113.4 kg   SpO2 98%   BMI 38.58 kg/m    Final Clinical Impressions(s) / ED Diagnoses   Final diagnoses:  Bug bite with infection, initial encounter    ED Discharge Orders         Ordered    cephALEXin (KEFLEX) 500 MG capsule  4 times daily     03/08/18 0730    hydrOXYzine (ATARAX/VISTARIL) 25 MG tablet  Every 8 hours PRN     03/08/18 0730         7:11 AM Patient here with localized skin irritation to left lower extremity.  He does appears to be localized reaction from potential insect bites.  It has potential to turn into  cellulitis.  He does not have the appearance to suggest DVT.  She is neurovascular intact.  No other rash noted.  Plan to prescribe Vistaril as needed for itchiness.  Given the moderate manifestations of this rash, and the potential for cellulitis, after discussion, we will prescribe antibiotic however I recommend watchful waiting before starting antibiotic.  Patient voiced understanding and agrees with plan. Care discussed with Dr. Laverta Baltimore.     Domenic Moras, PA-C 03/08/18 8768    Margette Fast, MD 03/08/18 2020

## 2018-03-08 NOTE — Telephone Encounter (Signed)
Pt called related to Rx not called to pharmacy.  [facility] asked provider to resend.

## 2018-03-08 NOTE — ED Triage Notes (Signed)
The pt is upstairs  With her husband and since last night she was bitten by some type bug. She has multiple red raised areas on her lt lower leg it itches and feels hot  It appears to be multiple bites

## 2018-03-08 NOTE — Discharge Instructions (Signed)
Your skin rash is likely due to insect bites.  Take vistaril as needed for itch.  Continue to apply other the counter hydrocortisone 1% cream as needed to the site.  If it become increasingly more painful or worse, then take keflex as prescribed to treat for potential cellulitis (skin infection).

## 2018-03-21 DIAGNOSIS — L501 Idiopathic urticaria: Secondary | ICD-10-CM | POA: Diagnosis not present

## 2018-04-19 DIAGNOSIS — L501 Idiopathic urticaria: Secondary | ICD-10-CM | POA: Diagnosis not present

## 2018-04-25 ENCOUNTER — Other Ambulatory Visit: Payer: Self-pay | Admitting: Internal Medicine

## 2018-04-25 DIAGNOSIS — Z1231 Encounter for screening mammogram for malignant neoplasm of breast: Secondary | ICD-10-CM

## 2018-05-14 DIAGNOSIS — M1A0790 Idiopathic chronic gout, unspecified ankle and foot, without tophus (tophi): Secondary | ICD-10-CM | POA: Diagnosis not present

## 2018-05-14 DIAGNOSIS — E782 Mixed hyperlipidemia: Secondary | ICD-10-CM | POA: Diagnosis not present

## 2018-05-14 DIAGNOSIS — I10 Essential (primary) hypertension: Secondary | ICD-10-CM | POA: Diagnosis not present

## 2018-05-14 DIAGNOSIS — E890 Postprocedural hypothyroidism: Secondary | ICD-10-CM | POA: Diagnosis not present

## 2018-05-14 DIAGNOSIS — Z0000 Encounter for general adult medical examination without abnormal findings: Secondary | ICD-10-CM | POA: Diagnosis not present

## 2018-05-14 DIAGNOSIS — Z23 Encounter for immunization: Secondary | ICD-10-CM | POA: Diagnosis not present

## 2018-05-22 DIAGNOSIS — Z0000 Encounter for general adult medical examination without abnormal findings: Secondary | ICD-10-CM | POA: Diagnosis not present

## 2018-05-22 DIAGNOSIS — I10 Essential (primary) hypertension: Secondary | ICD-10-CM | POA: Diagnosis not present

## 2018-05-22 DIAGNOSIS — N183 Chronic kidney disease, stage 3 (moderate): Secondary | ICD-10-CM | POA: Diagnosis not present

## 2018-05-22 DIAGNOSIS — R600 Localized edema: Secondary | ICD-10-CM | POA: Diagnosis not present

## 2018-05-22 DIAGNOSIS — E782 Mixed hyperlipidemia: Secondary | ICD-10-CM | POA: Diagnosis not present

## 2018-05-22 DIAGNOSIS — E890 Postprocedural hypothyroidism: Secondary | ICD-10-CM | POA: Diagnosis not present

## 2018-05-22 DIAGNOSIS — I129 Hypertensive chronic kidney disease with stage 1 through stage 4 chronic kidney disease, or unspecified chronic kidney disease: Secondary | ICD-10-CM | POA: Diagnosis not present

## 2018-05-22 DIAGNOSIS — M150 Primary generalized (osteo)arthritis: Secondary | ICD-10-CM | POA: Diagnosis not present

## 2018-05-22 DIAGNOSIS — M1A0790 Idiopathic chronic gout, unspecified ankle and foot, without tophus (tophi): Secondary | ICD-10-CM | POA: Diagnosis not present

## 2018-05-22 DIAGNOSIS — L501 Idiopathic urticaria: Secondary | ICD-10-CM | POA: Diagnosis not present

## 2018-06-01 ENCOUNTER — Ambulatory Visit
Admission: RE | Admit: 2018-06-01 | Discharge: 2018-06-01 | Disposition: A | Discharge status: Home / Self Care | Payer: Medicare Other | Source: Ambulatory Visit | Attending: Internal Medicine | Admitting: Internal Medicine

## 2018-06-01 DIAGNOSIS — Z1231 Encounter for screening mammogram for malignant neoplasm of breast: Secondary | ICD-10-CM

## 2018-06-05 ENCOUNTER — Other Ambulatory Visit: Payer: Self-pay | Admitting: Internal Medicine

## 2018-06-05 DIAGNOSIS — R928 Other abnormal and inconclusive findings on diagnostic imaging of breast: Secondary | ICD-10-CM

## 2018-06-11 ENCOUNTER — Ambulatory Visit
Admission: RE | Admit: 2018-06-11 | Discharge: 2018-06-11 | Disposition: A | Discharge status: Home / Self Care | Payer: Medicare Other | Source: Ambulatory Visit | Attending: Internal Medicine | Admitting: Internal Medicine

## 2018-06-11 ENCOUNTER — Other Ambulatory Visit: Payer: Self-pay | Admitting: Internal Medicine

## 2018-06-11 DIAGNOSIS — R928 Other abnormal and inconclusive findings on diagnostic imaging of breast: Secondary | ICD-10-CM

## 2018-06-11 DIAGNOSIS — Z09 Encounter for follow-up examination after completed treatment for conditions other than malignant neoplasm: Secondary | ICD-10-CM

## 2018-06-11 DIAGNOSIS — N6323 Unspecified lump in the left breast, lower outer quadrant: Secondary | ICD-10-CM | POA: Diagnosis not present

## 2018-06-12 ENCOUNTER — Other Ambulatory Visit: Payer: Self-pay | Admitting: Internal Medicine

## 2018-06-12 DIAGNOSIS — N63 Unspecified lump in unspecified breast: Secondary | ICD-10-CM

## 2018-06-20 DIAGNOSIS — L501 Idiopathic urticaria: Secondary | ICD-10-CM | POA: Diagnosis not present

## 2018-07-20 DIAGNOSIS — L501 Idiopathic urticaria: Secondary | ICD-10-CM | POA: Diagnosis not present

## 2018-07-23 DIAGNOSIS — Z6838 Body mass index (BMI) 38.0-38.9, adult: Secondary | ICD-10-CM | POA: Diagnosis not present

## 2018-07-23 DIAGNOSIS — Z01419 Encounter for gynecological examination (general) (routine) without abnormal findings: Secondary | ICD-10-CM | POA: Diagnosis not present

## 2018-07-27 DIAGNOSIS — L501 Idiopathic urticaria: Secondary | ICD-10-CM | POA: Diagnosis not present

## 2018-08-20 DIAGNOSIS — L501 Idiopathic urticaria: Secondary | ICD-10-CM | POA: Diagnosis not present

## 2018-09-18 DIAGNOSIS — L501 Idiopathic urticaria: Secondary | ICD-10-CM | POA: Diagnosis not present

## 2018-10-12 DIAGNOSIS — L501 Idiopathic urticaria: Secondary | ICD-10-CM | POA: Diagnosis not present

## 2018-11-15 DIAGNOSIS — L501 Idiopathic urticaria: Secondary | ICD-10-CM | POA: Diagnosis not present

## 2018-11-27 DIAGNOSIS — M1A0790 Idiopathic chronic gout, unspecified ankle and foot, without tophus (tophi): Secondary | ICD-10-CM | POA: Diagnosis not present

## 2018-11-27 DIAGNOSIS — E782 Mixed hyperlipidemia: Secondary | ICD-10-CM | POA: Diagnosis not present

## 2018-11-27 DIAGNOSIS — I10 Essential (primary) hypertension: Secondary | ICD-10-CM | POA: Diagnosis not present

## 2018-12-04 DIAGNOSIS — E782 Mixed hyperlipidemia: Secondary | ICD-10-CM | POA: Diagnosis not present

## 2018-12-04 DIAGNOSIS — E890 Postprocedural hypothyroidism: Secondary | ICD-10-CM | POA: Diagnosis not present

## 2018-12-04 DIAGNOSIS — Z7189 Other specified counseling: Secondary | ICD-10-CM | POA: Diagnosis not present

## 2018-12-04 DIAGNOSIS — I129 Hypertensive chronic kidney disease with stage 1 through stage 4 chronic kidney disease, or unspecified chronic kidney disease: Secondary | ICD-10-CM | POA: Diagnosis not present

## 2018-12-11 ENCOUNTER — Ambulatory Visit
Admission: RE | Admit: 2018-12-11 | Discharge: 2018-12-11 | Disposition: A | Discharge status: Home / Self Care | Payer: Medicare Other | Source: Ambulatory Visit | Attending: Internal Medicine | Admitting: Internal Medicine

## 2018-12-11 ENCOUNTER — Other Ambulatory Visit: Payer: Self-pay | Admitting: Internal Medicine

## 2018-12-11 ENCOUNTER — Other Ambulatory Visit: Payer: Self-pay

## 2018-12-11 DIAGNOSIS — N63 Unspecified lump in unspecified breast: Secondary | ICD-10-CM

## 2018-12-11 DIAGNOSIS — N6320 Unspecified lump in the left breast, unspecified quadrant: Secondary | ICD-10-CM | POA: Diagnosis not present

## 2018-12-14 DIAGNOSIS — L501 Idiopathic urticaria: Secondary | ICD-10-CM | POA: Diagnosis not present

## 2019-01-15 DIAGNOSIS — L501 Idiopathic urticaria: Secondary | ICD-10-CM | POA: Diagnosis not present

## 2019-01-24 DIAGNOSIS — H26491 Other secondary cataract, right eye: Secondary | ICD-10-CM | POA: Diagnosis not present

## 2019-01-24 DIAGNOSIS — H5213 Myopia, bilateral: Secondary | ICD-10-CM | POA: Diagnosis not present

## 2019-01-24 DIAGNOSIS — H524 Presbyopia: Secondary | ICD-10-CM | POA: Diagnosis not present

## 2019-01-30 DIAGNOSIS — H53001 Unspecified amblyopia, right eye: Secondary | ICD-10-CM | POA: Diagnosis not present

## 2019-01-30 DIAGNOSIS — H26491 Other secondary cataract, right eye: Secondary | ICD-10-CM | POA: Diagnosis not present

## 2019-02-19 DIAGNOSIS — H26491 Other secondary cataract, right eye: Secondary | ICD-10-CM | POA: Diagnosis not present

## 2019-02-20 DIAGNOSIS — L501 Idiopathic urticaria: Secondary | ICD-10-CM | POA: Diagnosis not present

## 2019-03-21 DIAGNOSIS — L501 Idiopathic urticaria: Secondary | ICD-10-CM | POA: Diagnosis not present

## 2019-04-11 DIAGNOSIS — Z23 Encounter for immunization: Secondary | ICD-10-CM | POA: Diagnosis not present

## 2019-04-18 DIAGNOSIS — L501 Idiopathic urticaria: Secondary | ICD-10-CM | POA: Diagnosis not present

## 2019-05-20 DIAGNOSIS — L501 Idiopathic urticaria: Secondary | ICD-10-CM | POA: Diagnosis not present

## 2019-05-21 DIAGNOSIS — I10 Essential (primary) hypertension: Secondary | ICD-10-CM | POA: Diagnosis not present

## 2019-05-21 DIAGNOSIS — E782 Mixed hyperlipidemia: Secondary | ICD-10-CM | POA: Diagnosis not present

## 2019-05-21 DIAGNOSIS — Z780 Asymptomatic menopausal state: Secondary | ICD-10-CM | POA: Diagnosis not present

## 2019-05-21 DIAGNOSIS — Z7189 Other specified counseling: Secondary | ICD-10-CM | POA: Diagnosis not present

## 2019-05-21 DIAGNOSIS — E890 Postprocedural hypothyroidism: Secondary | ICD-10-CM | POA: Diagnosis not present

## 2019-05-21 DIAGNOSIS — Z0000 Encounter for general adult medical examination without abnormal findings: Secondary | ICD-10-CM | POA: Diagnosis not present

## 2019-05-21 DIAGNOSIS — I129 Hypertensive chronic kidney disease with stage 1 through stage 4 chronic kidney disease, or unspecified chronic kidney disease: Secondary | ICD-10-CM | POA: Diagnosis not present

## 2019-05-28 DIAGNOSIS — M150 Primary generalized (osteo)arthritis: Secondary | ICD-10-CM | POA: Diagnosis not present

## 2019-05-28 DIAGNOSIS — I10 Essential (primary) hypertension: Secondary | ICD-10-CM | POA: Diagnosis not present

## 2019-05-28 DIAGNOSIS — Z7189 Other specified counseling: Secondary | ICD-10-CM | POA: Diagnosis not present

## 2019-05-28 DIAGNOSIS — L501 Idiopathic urticaria: Secondary | ICD-10-CM | POA: Diagnosis not present

## 2019-05-28 DIAGNOSIS — I129 Hypertensive chronic kidney disease with stage 1 through stage 4 chronic kidney disease, or unspecified chronic kidney disease: Secondary | ICD-10-CM | POA: Diagnosis not present

## 2019-05-28 DIAGNOSIS — E890 Postprocedural hypothyroidism: Secondary | ICD-10-CM | POA: Diagnosis not present

## 2019-05-28 DIAGNOSIS — E782 Mixed hyperlipidemia: Secondary | ICD-10-CM | POA: Diagnosis not present

## 2019-05-28 DIAGNOSIS — M1A0790 Idiopathic chronic gout, unspecified ankle and foot, without tophus (tophi): Secondary | ICD-10-CM | POA: Diagnosis not present

## 2019-06-17 ENCOUNTER — Other Ambulatory Visit: Payer: Self-pay

## 2019-06-17 ENCOUNTER — Ambulatory Visit
Admission: RE | Admit: 2019-06-17 | Discharge: 2019-06-17 | Disposition: A | Discharge status: Home / Self Care | Payer: Medicare Other | Source: Ambulatory Visit | Attending: Internal Medicine | Admitting: Internal Medicine

## 2019-06-17 ENCOUNTER — Other Ambulatory Visit: Payer: Self-pay | Admitting: Internal Medicine

## 2019-06-17 DIAGNOSIS — R928 Other abnormal and inconclusive findings on diagnostic imaging of breast: Secondary | ICD-10-CM | POA: Diagnosis not present

## 2019-06-17 DIAGNOSIS — N63 Unspecified lump in unspecified breast: Secondary | ICD-10-CM

## 2019-06-17 DIAGNOSIS — N6320 Unspecified lump in the left breast, unspecified quadrant: Secondary | ICD-10-CM | POA: Diagnosis not present

## 2019-07-02 DIAGNOSIS — Z1211 Encounter for screening for malignant neoplasm of colon: Secondary | ICD-10-CM | POA: Diagnosis not present

## 2019-07-02 DIAGNOSIS — D175 Benign lipomatous neoplasm of intra-abdominal organs: Secondary | ICD-10-CM | POA: Diagnosis not present

## 2019-07-02 DIAGNOSIS — K648 Other hemorrhoids: Secondary | ICD-10-CM | POA: Diagnosis not present

## 2019-07-02 DIAGNOSIS — K635 Polyp of colon: Secondary | ICD-10-CM | POA: Diagnosis not present

## 2019-07-02 DIAGNOSIS — K5730 Diverticulosis of large intestine without perforation or abscess without bleeding: Secondary | ICD-10-CM | POA: Diagnosis not present

## 2019-07-02 DIAGNOSIS — Z86010 Personal history of colonic polyps: Secondary | ICD-10-CM | POA: Diagnosis not present

## 2019-07-22 DIAGNOSIS — L501 Idiopathic urticaria: Secondary | ICD-10-CM | POA: Diagnosis not present

## 2019-07-29 DIAGNOSIS — L501 Idiopathic urticaria: Secondary | ICD-10-CM | POA: Diagnosis not present

## 2019-07-29 IMAGING — US ULTRASOUND LEFT BREAST LIMITED
1 series · 10 of 10 positions shown · non-contrast
Comparison: Previous exam(s).

CLINICAL DATA: Patient was called back from screening mammogram for
a possible asymmetry in the left breast.

EXAM:
DIGITAL DIAGNOSTIC LEFT MAMMOGRAM WITH CAD AND TOMO
ULTRASOUND LEFT BREAST

[Series 1: ultrasound left breast limited · 0.06mm/px · 10 of 10 slices shown]
[im 1/10]
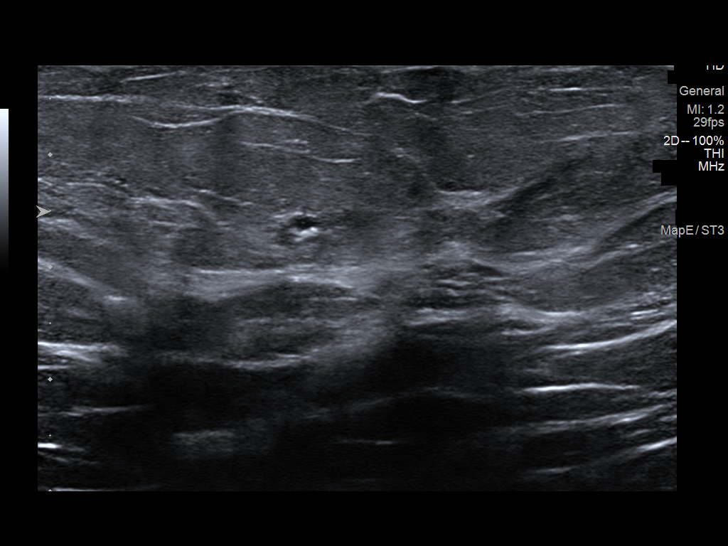
[im 2/10]
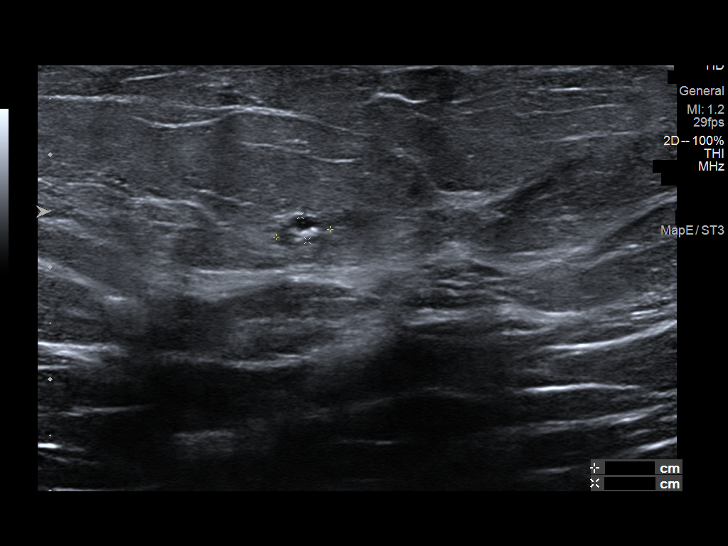
[im 3/10]
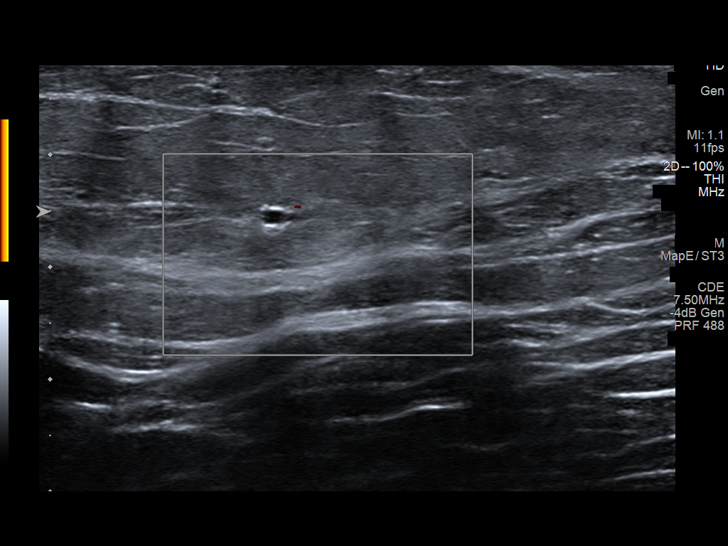
[im 4/10]
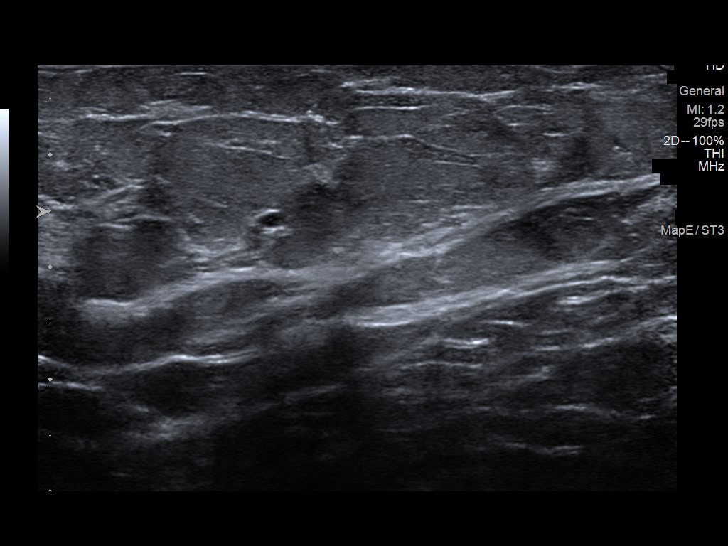
[im 5/10]
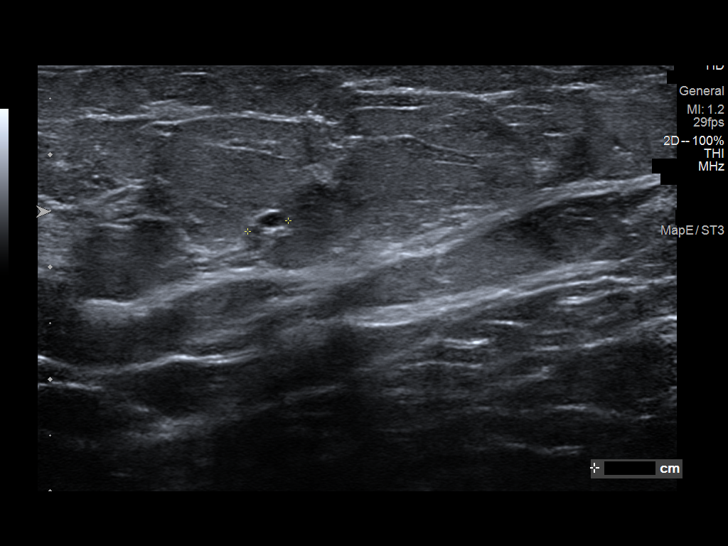
[im 6/10]
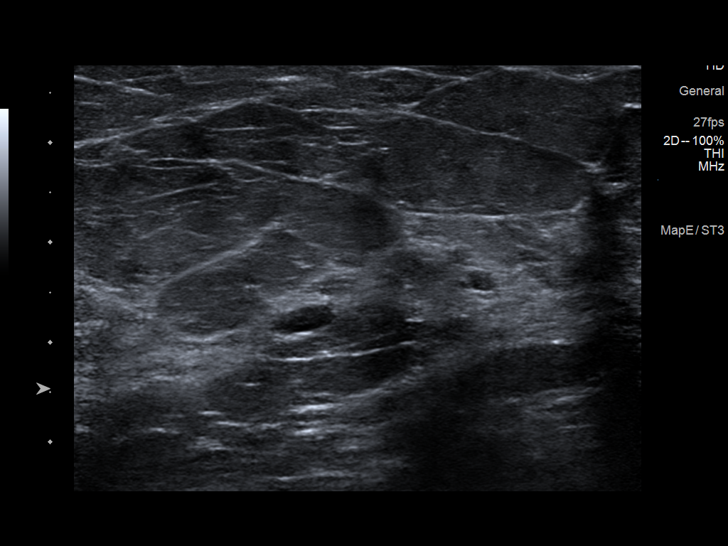
[im 7/10]
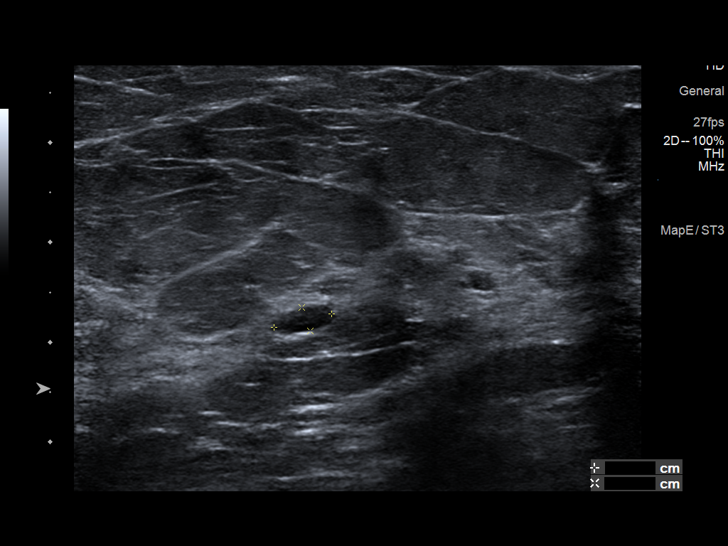
[im 8/10]
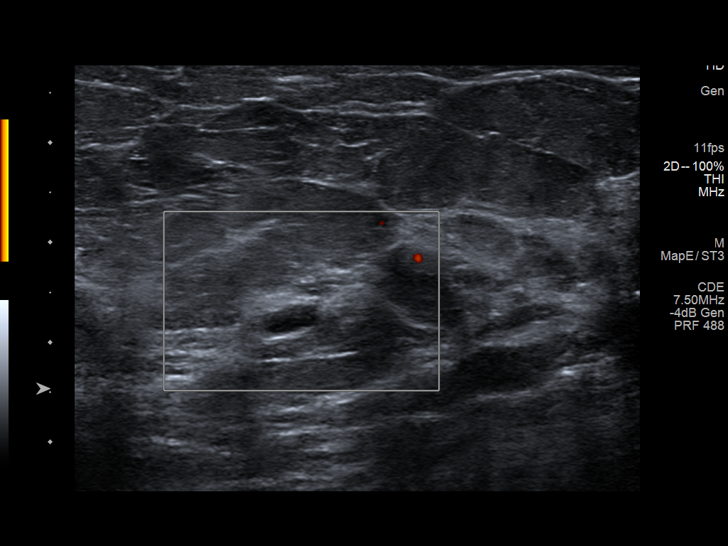
[im 9/10]
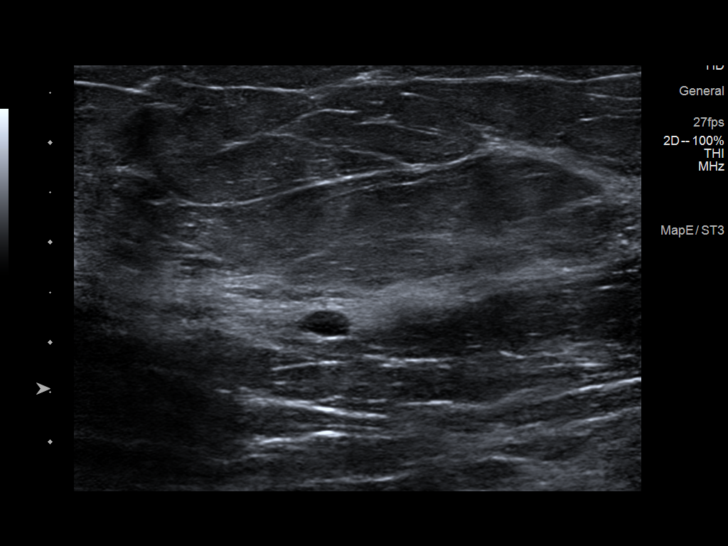
[im 10/10]
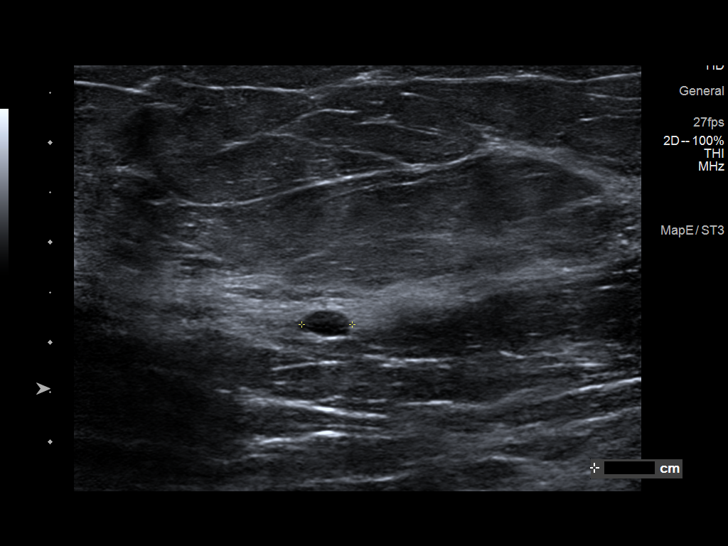

[10 of 10 positions shown; findings below may reference images not displayed]

ACR Breast Density Category b: There are scattered areas of
fibroglandular density.
FINDINGS: Additional imaging of the left breast was performed. There is
persistence of a 5 mm mass in the lateral aspect of the breast.
There are no malignant type microcalcifications.

Mammographic images were processed with CAD.

Targeted ultrasound is performed, showing a hypoechoic mass with
central fat at [DATE] 4 cm from the nipple measuring 5 x 2 x 4 mm
likely an intramammary lymph node and the abnormality seen on the
mammogram. There is also a near anechoic lesion at 2 o'clock 6 cm
from the nipple measuring 6 x 3 x 5 mm. This is likely a benign
cyst.
IMPRESSION: Probable benign findings in the left breast.

RECOMMENDATION:
Short-term interval follow-up left breast ultrasound 6 months is
recommended.

I have discussed the findings and recommendations with the patient.
Results were also provided in writing at the conclusion of the
visit. If applicable, a reminder letter will be sent to the patient
regarding the next appointment.

BI-RADS CATEGORY  3: Probably benign.

## 2019-07-29 IMAGING — [facility] DIGITAL DIAGNOSTIC UNILATERAL LEFT MAMMOGRAM WITH TOMO AND CAD
4 series · 4 of 12 positions shown · non-contrast
Comparison: Previous exam(s).

CLINICAL DATA: Patient was called back from screening mammogram for
a possible asymmetry in the left breast.

EXAM:
DIGITAL DIAGNOSTIC LEFT MAMMOGRAM WITH CAD AND TOMO
ULTRASOUND LEFT BREAST

[L ML synth-2D]
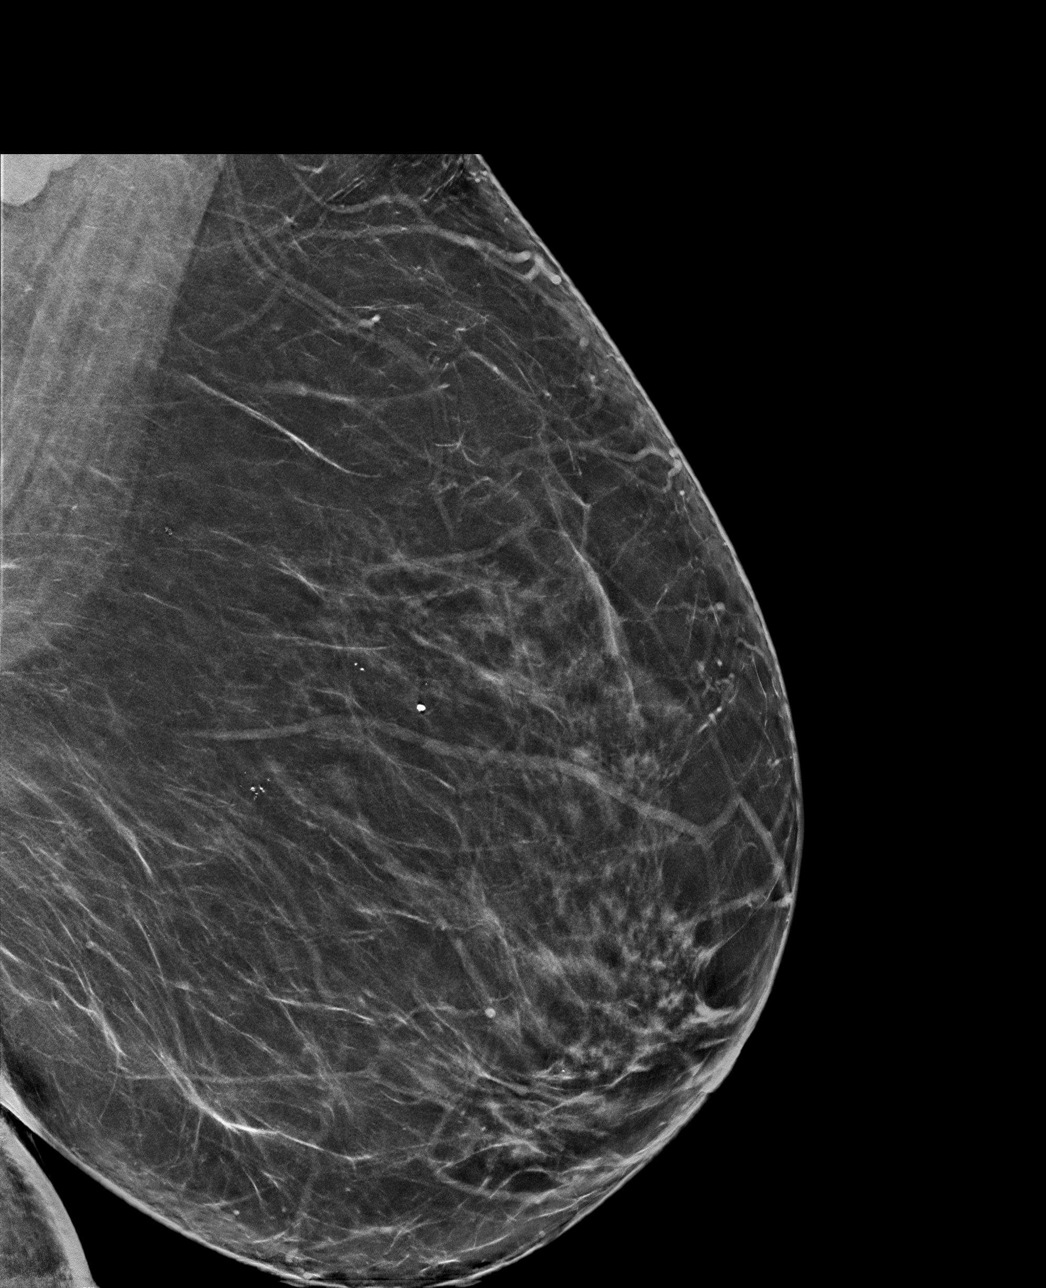

[L CC synth-2D]
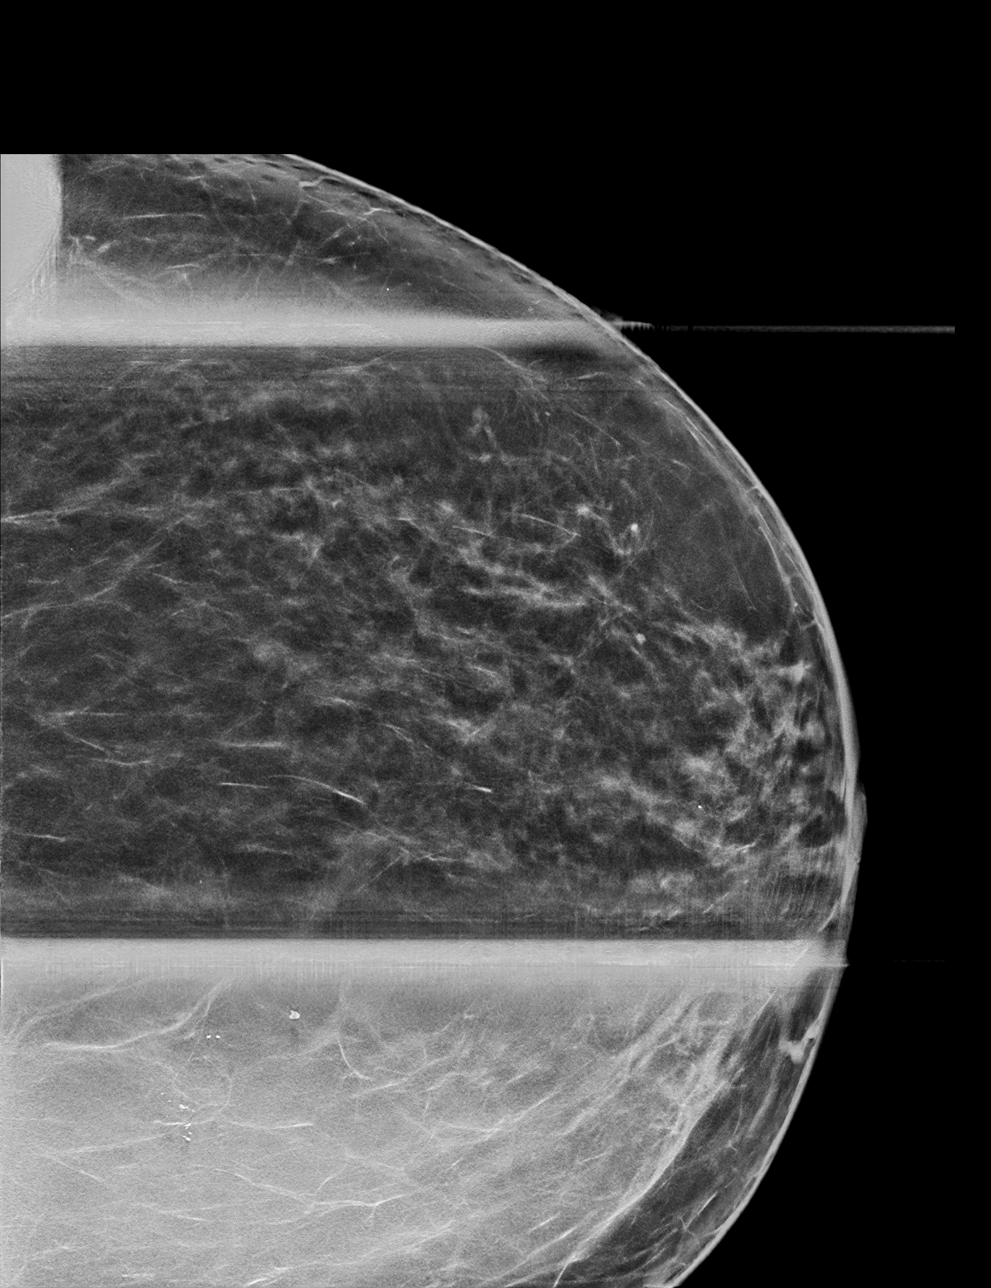

[L ML tomo · tomo slice 43/85.0]
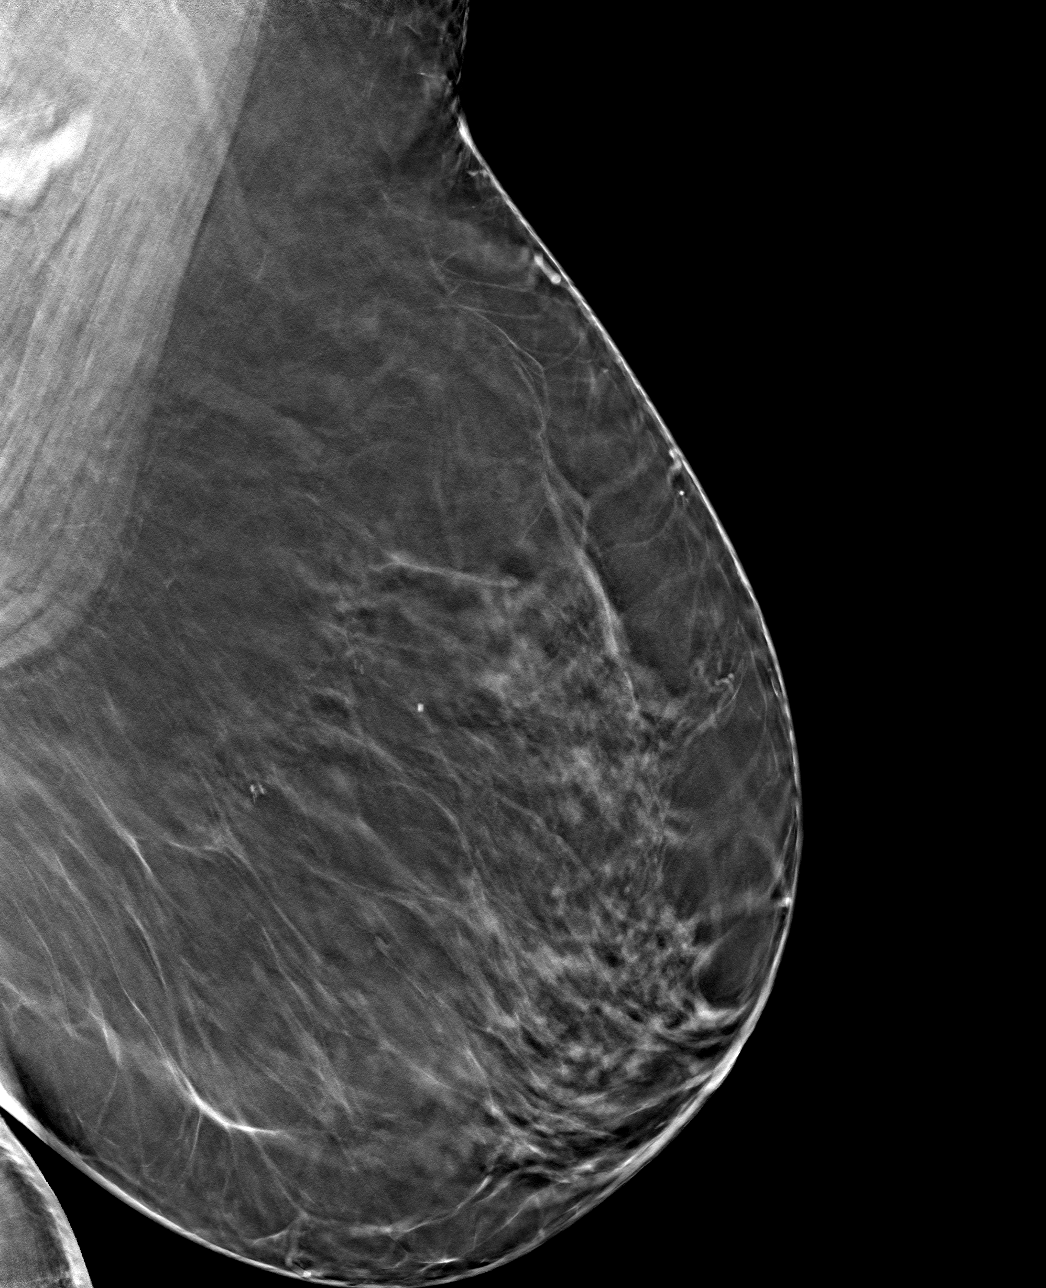

[L CC tomo · tomo slice 37/72.0]
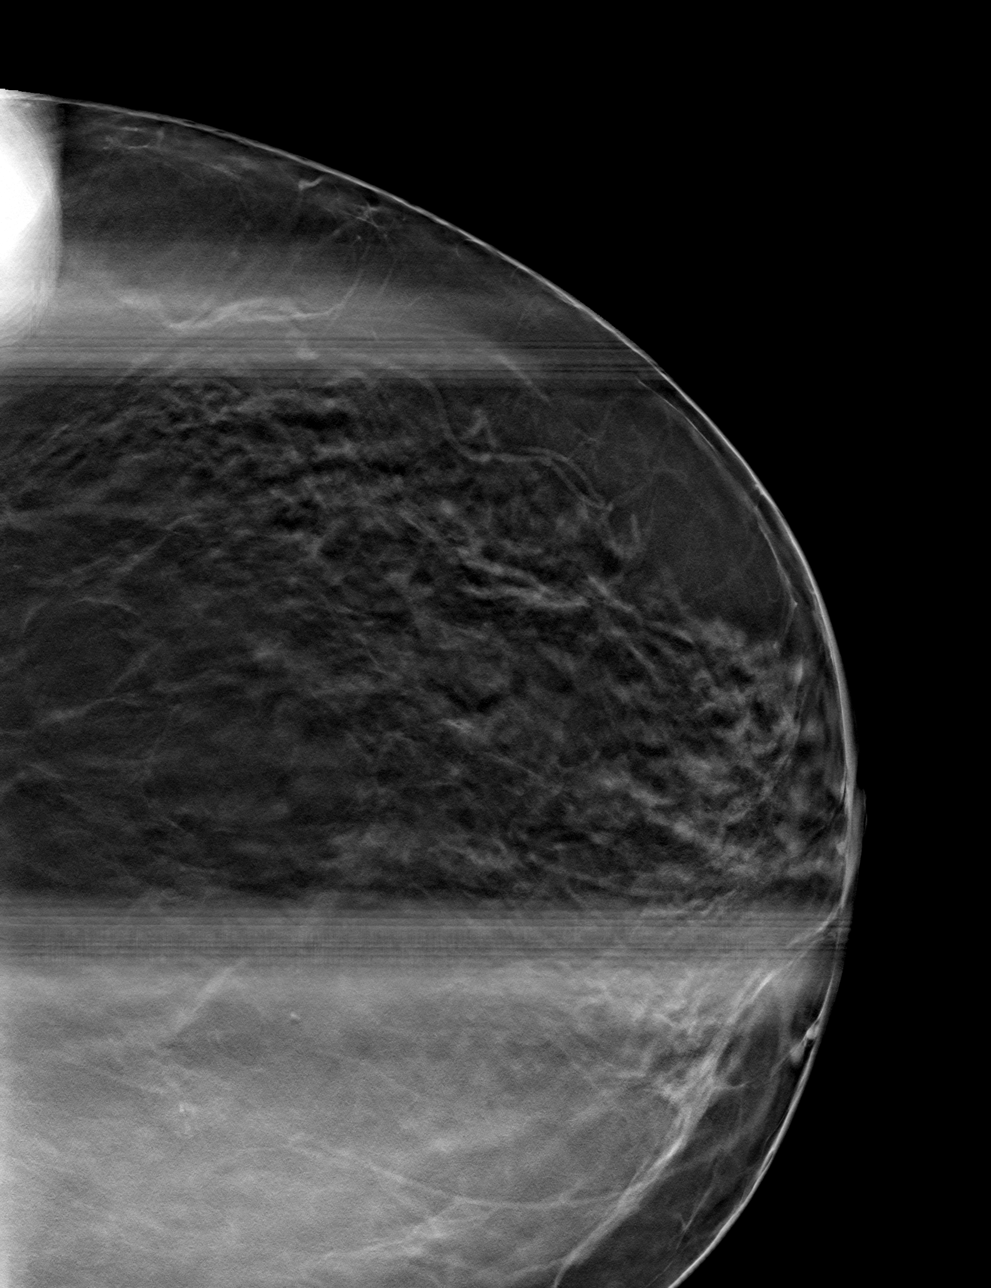

[4 of 12 positions shown; findings below may reference images not displayed]

ACR Breast Density Category b: There are scattered areas of
fibroglandular density.
FINDINGS: Additional imaging of the left breast was performed. There is
persistence of a 5 mm mass in the lateral aspect of the breast.
There are no malignant type microcalcifications.

Mammographic images were processed with CAD.

Targeted ultrasound is performed, showing a hypoechoic mass with
central fat at [DATE] 4 cm from the nipple measuring 5 x 2 x 4 mm
likely an intramammary lymph node and the abnormality seen on the
mammogram. There is also a near anechoic lesion at 2 o'clock 6 cm
from the nipple measuring 6 x 3 x 5 mm. This is likely a benign
cyst.
IMPRESSION: Probable benign findings in the left breast.

RECOMMENDATION:
Short-term interval follow-up left breast ultrasound 6 months is
recommended.

I have discussed the findings and recommendations with the patient.
Results were also provided in writing at the conclusion of the
visit. If applicable, a reminder letter will be sent to the patient
regarding the next appointment.

BI-RADS CATEGORY  3: Probably benign.

## 2019-08-10 ENCOUNTER — Ambulatory Visit: Payer: Medicare Other | Attending: Internal Medicine

## 2019-08-10 DIAGNOSIS — Z23 Encounter for immunization: Secondary | ICD-10-CM | POA: Insufficient documentation

## 2019-08-10 NOTE — Progress Notes (Signed)
   Covid-19 Vaccination Clinic  Name:  Ja Pistole    MRN: [id number] DOB: 1941-03-01  08/10/2019  Ms. Goza was observed post Covid-19 immunization for 15 minutes without incidence. She was provided with Vaccine Information Sheet and instruction to access the V-Safe system.   Ms. Reino was instructed to call 911 with any severe reactions post vaccine: Marland Kitchen Difficulty breathing  . Swelling of your face and throat  . A fast heartbeat  . A bad rash all over your body  . Dizziness and weakness    Immunizations Administered    Name Date Dose VIS Date Route   Pfizer COVID-19 Vaccine 08/10/2019 11:39 AM 0.3 mL 06/28/2019 Intramuscular   Manufacturer: [place]   Lot: [id number]   [place]: [id number]

## 2019-08-31 ENCOUNTER — Ambulatory Visit: Payer: Medicare Other | Attending: Internal Medicine

## 2019-08-31 DIAGNOSIS — Z23 Encounter for immunization: Secondary | ICD-10-CM

## 2019-08-31 NOTE — Progress Notes (Signed)
   Covid-19 Vaccination Clinic  Name:  Yuli Lanigan    MRN: [id number] DOB: 20-Jun-1941  08/31/2019  Ms. Baucom was observed post Covid-19 immunization for 15 minutes without incidence. She was provided with Vaccine Information Sheet and instruction to access the V-Safe system.   Ms. Mariscal was instructed to call 911 with any severe reactions post vaccine: Marland Kitchen Difficulty breathing  . Swelling of your face and throat  . A fast heartbeat  . A bad rash all over your body  . Dizziness and weakness    Immunizations Administered    Name Date Dose VIS Date Route   Pfizer COVID-19 Vaccine 08/31/2019 10:13 AM 0.3 mL 06/28/2019 Intramuscular   Manufacturer: [place]   Lot: [id number]   [place]: [id number]

## 2019-09-30 DIAGNOSIS — L501 Idiopathic urticaria: Secondary | ICD-10-CM | POA: Diagnosis not present

## 2019-10-31 DIAGNOSIS — L501 Idiopathic urticaria: Secondary | ICD-10-CM | POA: Diagnosis not present

## 2019-11-29 DIAGNOSIS — L501 Idiopathic urticaria: Secondary | ICD-10-CM | POA: Diagnosis not present

## 2019-12-02 DIAGNOSIS — Z01419 Encounter for gynecological examination (general) (routine) without abnormal findings: Secondary | ICD-10-CM | POA: Diagnosis not present

## 2019-12-02 DIAGNOSIS — Z6839 Body mass index (BMI) 39.0-39.9, adult: Secondary | ICD-10-CM | POA: Diagnosis not present

## 2019-12-11 DIAGNOSIS — M1A0790 Idiopathic chronic gout, unspecified ankle and foot, without tophus (tophi): Secondary | ICD-10-CM | POA: Diagnosis not present

## 2019-12-11 DIAGNOSIS — I129 Hypertensive chronic kidney disease with stage 1 through stage 4 chronic kidney disease, or unspecified chronic kidney disease: Secondary | ICD-10-CM | POA: Diagnosis not present

## 2019-12-11 DIAGNOSIS — I10 Essential (primary) hypertension: Secondary | ICD-10-CM | POA: Diagnosis not present

## 2019-12-11 DIAGNOSIS — E782 Mixed hyperlipidemia: Secondary | ICD-10-CM | POA: Diagnosis not present

## 2019-12-18 ENCOUNTER — Inpatient Hospital Stay: Admission: RE | Admit: 2019-12-18 | Payer: Medicare Other | Source: Ambulatory Visit

## 2019-12-18 DIAGNOSIS — M1A0790 Idiopathic chronic gout, unspecified ankle and foot, without tophus (tophi): Secondary | ICD-10-CM | POA: Diagnosis not present

## 2019-12-18 DIAGNOSIS — E782 Mixed hyperlipidemia: Secondary | ICD-10-CM | POA: Diagnosis not present

## 2019-12-18 DIAGNOSIS — L501 Idiopathic urticaria: Secondary | ICD-10-CM | POA: Diagnosis not present

## 2019-12-18 DIAGNOSIS — I129 Hypertensive chronic kidney disease with stage 1 through stage 4 chronic kidney disease, or unspecified chronic kidney disease: Secondary | ICD-10-CM | POA: Diagnosis not present

## 2019-12-18 DIAGNOSIS — L309 Dermatitis, unspecified: Secondary | ICD-10-CM | POA: Diagnosis not present

## 2019-12-18 DIAGNOSIS — M150 Primary generalized (osteo)arthritis: Secondary | ICD-10-CM | POA: Diagnosis not present

## 2019-12-18 DIAGNOSIS — I10 Essential (primary) hypertension: Secondary | ICD-10-CM | POA: Diagnosis not present

## 2019-12-18 DIAGNOSIS — E890 Postprocedural hypothyroidism: Secondary | ICD-10-CM | POA: Diagnosis not present

## 2019-12-19 ENCOUNTER — Ambulatory Visit
Admission: RE | Admit: 2019-12-19 | Discharge: 2019-12-19 | Disposition: A | Discharge status: Home / Self Care | Payer: Medicare Other | Source: Ambulatory Visit | Attending: Internal Medicine | Admitting: Internal Medicine

## 2019-12-19 ENCOUNTER — Other Ambulatory Visit: Payer: Self-pay | Admitting: Internal Medicine

## 2019-12-19 ENCOUNTER — Other Ambulatory Visit: Payer: Self-pay

## 2019-12-19 DIAGNOSIS — N63 Unspecified lump in unspecified breast: Secondary | ICD-10-CM

## 2019-12-19 DIAGNOSIS — R928 Other abnormal and inconclusive findings on diagnostic imaging of breast: Secondary | ICD-10-CM | POA: Diagnosis not present

## 2019-12-19 DIAGNOSIS — N6321 Unspecified lump in the left breast, upper outer quadrant: Secondary | ICD-10-CM | POA: Diagnosis not present

## 2020-01-30 DIAGNOSIS — L501 Idiopathic urticaria: Secondary | ICD-10-CM | POA: Diagnosis not present

## 2020-03-02 DIAGNOSIS — L501 Idiopathic urticaria: Secondary | ICD-10-CM | POA: Diagnosis not present

## 2020-04-01 DIAGNOSIS — L501 Idiopathic urticaria: Secondary | ICD-10-CM | POA: Diagnosis not present

## 2020-04-14 DIAGNOSIS — Z23 Encounter for immunization: Secondary | ICD-10-CM | POA: Diagnosis not present

## 2020-05-01 DIAGNOSIS — L501 Idiopathic urticaria: Secondary | ICD-10-CM | POA: Diagnosis not present

## 2020-05-11 DIAGNOSIS — H8110 Benign paroxysmal vertigo, unspecified ear: Secondary | ICD-10-CM | POA: Diagnosis not present

## 2020-05-11 DIAGNOSIS — R5383 Other fatigue: Secondary | ICD-10-CM | POA: Diagnosis not present

## 2020-06-04 DIAGNOSIS — L501 Idiopathic urticaria: Secondary | ICD-10-CM | POA: Diagnosis not present

## 2020-06-05 DIAGNOSIS — M25552 Pain in left hip: Secondary | ICD-10-CM | POA: Diagnosis not present

## 2020-06-05 DIAGNOSIS — M545 Low back pain, unspecified: Secondary | ICD-10-CM | POA: Diagnosis not present

## 2020-06-19 DIAGNOSIS — E890 Postprocedural hypothyroidism: Secondary | ICD-10-CM | POA: Diagnosis not present

## 2020-06-19 DIAGNOSIS — E782 Mixed hyperlipidemia: Secondary | ICD-10-CM | POA: Diagnosis not present

## 2020-06-19 DIAGNOSIS — Z0000 Encounter for general adult medical examination without abnormal findings: Secondary | ICD-10-CM | POA: Diagnosis not present

## 2020-06-19 DIAGNOSIS — M1A0790 Idiopathic chronic gout, unspecified ankle and foot, without tophus (tophi): Secondary | ICD-10-CM | POA: Diagnosis not present

## 2020-06-19 DIAGNOSIS — I10 Essential (primary) hypertension: Secondary | ICD-10-CM | POA: Diagnosis not present

## 2020-06-19 DIAGNOSIS — I129 Hypertensive chronic kidney disease with stage 1 through stage 4 chronic kidney disease, or unspecified chronic kidney disease: Secondary | ICD-10-CM | POA: Diagnosis not present

## 2020-06-22 ENCOUNTER — Ambulatory Visit
Admission: RE | Admit: 2020-06-22 | Discharge: 2020-06-22 | Disposition: A | Discharge status: Home / Self Care | Payer: Medicare Other | Source: Ambulatory Visit | Attending: Internal Medicine | Admitting: Internal Medicine

## 2020-06-22 ENCOUNTER — Other Ambulatory Visit: Payer: Self-pay

## 2020-06-22 DIAGNOSIS — N63 Unspecified lump in unspecified breast: Secondary | ICD-10-CM

## 2020-06-22 DIAGNOSIS — R928 Other abnormal and inconclusive findings on diagnostic imaging of breast: Secondary | ICD-10-CM | POA: Diagnosis not present

## 2020-06-22 DIAGNOSIS — N6321 Unspecified lump in the left breast, upper outer quadrant: Secondary | ICD-10-CM | POA: Diagnosis not present

## 2020-06-24 DIAGNOSIS — M150 Primary generalized (osteo)arthritis: Secondary | ICD-10-CM | POA: Diagnosis not present

## 2020-06-24 DIAGNOSIS — E782 Mixed hyperlipidemia: Secondary | ICD-10-CM | POA: Diagnosis not present

## 2020-06-24 DIAGNOSIS — M1A0790 Idiopathic chronic gout, unspecified ankle and foot, without tophus (tophi): Secondary | ICD-10-CM | POA: Diagnosis not present

## 2020-06-24 DIAGNOSIS — I10 Essential (primary) hypertension: Secondary | ICD-10-CM | POA: Diagnosis not present

## 2020-06-24 DIAGNOSIS — E890 Postprocedural hypothyroidism: Secondary | ICD-10-CM | POA: Diagnosis not present

## 2020-06-24 DIAGNOSIS — L501 Idiopathic urticaria: Secondary | ICD-10-CM | POA: Diagnosis not present

## 2020-06-24 DIAGNOSIS — I129 Hypertensive chronic kidney disease with stage 1 through stage 4 chronic kidney disease, or unspecified chronic kidney disease: Secondary | ICD-10-CM | POA: Diagnosis not present

## 2020-06-24 DIAGNOSIS — M5432 Sciatica, left side: Secondary | ICD-10-CM | POA: Diagnosis not present

## 2020-06-24 DIAGNOSIS — R600 Localized edema: Secondary | ICD-10-CM | POA: Diagnosis not present

## 2020-06-24 DIAGNOSIS — N1831 Chronic kidney disease, stage 3a: Secondary | ICD-10-CM | POA: Diagnosis not present

## 2020-07-01 DIAGNOSIS — L501 Idiopathic urticaria: Secondary | ICD-10-CM | POA: Diagnosis not present

## 2020-07-27 DIAGNOSIS — M25552 Pain in left hip: Secondary | ICD-10-CM | POA: Diagnosis not present

## 2020-07-27 DIAGNOSIS — M5459 Other low back pain: Secondary | ICD-10-CM | POA: Diagnosis not present

## 2020-07-27 DIAGNOSIS — M545 Low back pain, unspecified: Secondary | ICD-10-CM | POA: Diagnosis not present

## 2020-07-29 DIAGNOSIS — L501 Idiopathic urticaria: Secondary | ICD-10-CM | POA: Diagnosis not present

## 2020-08-19 DIAGNOSIS — M5459 Other low back pain: Secondary | ICD-10-CM | POA: Diagnosis not present

## 2020-08-19 DIAGNOSIS — M545 Low back pain, unspecified: Secondary | ICD-10-CM | POA: Diagnosis not present

## 2020-09-01 DIAGNOSIS — M5459 Other low back pain: Secondary | ICD-10-CM | POA: Diagnosis not present

## 2020-09-01 DIAGNOSIS — L501 Idiopathic urticaria: Secondary | ICD-10-CM | POA: Diagnosis not present

## 2020-09-15 DIAGNOSIS — M25552 Pain in left hip: Secondary | ICD-10-CM | POA: Diagnosis not present

## 2020-09-15 DIAGNOSIS — M545 Low back pain, unspecified: Secondary | ICD-10-CM | POA: Diagnosis not present

## 2020-09-22 DIAGNOSIS — M25552 Pain in left hip: Secondary | ICD-10-CM | POA: Diagnosis not present

## 2020-09-22 DIAGNOSIS — M545 Low back pain, unspecified: Secondary | ICD-10-CM | POA: Diagnosis not present

## 2020-09-28 DIAGNOSIS — R0602 Shortness of breath: Secondary | ICD-10-CM | POA: Diagnosis not present

## 2020-09-28 DIAGNOSIS — I10 Essential (primary) hypertension: Secondary | ICD-10-CM | POA: Diagnosis not present

## 2020-09-28 DIAGNOSIS — N1831 Chronic kidney disease, stage 3a: Secondary | ICD-10-CM | POA: Diagnosis not present

## 2020-09-28 DIAGNOSIS — R600 Localized edema: Secondary | ICD-10-CM | POA: Diagnosis not present

## 2020-09-29 DIAGNOSIS — L501 Idiopathic urticaria: Secondary | ICD-10-CM | POA: Diagnosis not present

## 2020-10-06 DIAGNOSIS — N1831 Chronic kidney disease, stage 3a: Secondary | ICD-10-CM | POA: Diagnosis not present

## 2020-10-06 DIAGNOSIS — M545 Low back pain, unspecified: Secondary | ICD-10-CM | POA: Diagnosis not present

## 2020-10-06 DIAGNOSIS — R600 Localized edema: Secondary | ICD-10-CM | POA: Diagnosis not present

## 2020-10-06 DIAGNOSIS — R0602 Shortness of breath: Secondary | ICD-10-CM | POA: Diagnosis not present

## 2020-10-08 DIAGNOSIS — R600 Localized edema: Secondary | ICD-10-CM | POA: Diagnosis not present

## 2020-10-08 DIAGNOSIS — N1831 Chronic kidney disease, stage 3a: Secondary | ICD-10-CM | POA: Diagnosis not present

## 2020-10-08 DIAGNOSIS — R0602 Shortness of breath: Secondary | ICD-10-CM | POA: Diagnosis not present

## 2020-10-08 DIAGNOSIS — R062 Wheezing: Secondary | ICD-10-CM | POA: Diagnosis not present

## 2020-10-13 DIAGNOSIS — M545 Low back pain, unspecified: Secondary | ICD-10-CM | POA: Diagnosis not present

## 2020-10-23 DIAGNOSIS — M25552 Pain in left hip: Secondary | ICD-10-CM | POA: Diagnosis not present

## 2020-10-23 DIAGNOSIS — M545 Low back pain, unspecified: Secondary | ICD-10-CM | POA: Diagnosis not present

## 2020-10-27 ENCOUNTER — Other Ambulatory Visit: Payer: Self-pay

## 2020-10-27 ENCOUNTER — Ambulatory Visit (INDEPENDENT_AMBULATORY_CARE_PROVIDER_SITE_OTHER): Payer: Medicare Other | Admitting: Cardiovascular Disease

## 2020-10-27 VITALS — BP 140/84 | HR 61 | Ht 67.5 in | Wt 264.0 lb

## 2020-10-27 DIAGNOSIS — Z8739 Personal history of other diseases of the musculoskeletal system and connective tissue: Secondary | ICD-10-CM | POA: Diagnosis not present

## 2020-10-27 DIAGNOSIS — I10 Essential (primary) hypertension: Secondary | ICD-10-CM | POA: Diagnosis not present

## 2020-10-27 DIAGNOSIS — I5032 Chronic diastolic (congestive) heart failure: Secondary | ICD-10-CM

## 2020-10-27 DIAGNOSIS — N1831 Chronic kidney disease, stage 3a: Secondary | ICD-10-CM | POA: Diagnosis not present

## 2020-10-27 MED ORDER — FUROSEMIDE 80 MG PO TABS: ORAL_TABLET | ORAL | 3 refills | Status: DC

## 2020-10-27 NOTE — Patient Instructions (Signed)
Medication Instructions:  FUROSEMIDE: Take 80 mg once daily for a weight less than 250 lbs and take 120 mg for a weight over 250 lbs.  *If you need a refill on your cardiac medications before your next appointment, please call your pharmacy*   Lab Work: None ordered If you have labs (blood work) drawn today and your tests are completely normal, you will receive your results only by: Marland Kitchen MyChart Message (if you have MyChart) OR . A paper copy in the mail If you have any lab test that is abnormal or we need to change your treatment, we will call you to review the results.   Testing/Procedures: None ordered   Follow-Up: At [facility], you and your health needs are our priority.  As part of our continuing mission to provide you with exceptional heart care, we have created designated Provider Care Teams.  These Care Teams include your primary Cardiologist (physician) and Advanced Practice Providers (APPs -  Physician Assistants and Nurse Practitioners) who all work together to provide you with the care you need, when you need it.  We recommend signing up for the patient portal called "MyChart".  Sign up information is provided on this After Visit Summary.  MyChart is used to connect with patients for Virtual Visits (Telemedicine).  Patients are able to view lab/test results, encounter notes, upcoming appointments, etc.  Non-urgent messages can be sent to your provider as well.   To learn more about what you can do with MyChart, go to [email or web address].    Your next appointment:   4 month(s)  The format for your next appointment:   In Person  Provider:   You may see Sanda Klein, MD or one of the following Advanced Practice Providers on your designated Care Team:    Almyra Deforest, PA-C  Fabian Sharp, PA-C or   Roby Lofts, [place]

## 2020-10-27 NOTE — Progress Notes (Signed)
Cardiology Office Note:    Date:  10/30/2020   ID:  Brittany [name], DOB 10-Dec-1940, MRN [id number]  PCP:  Merrilee Seashore, [place]  Cardiologist:  Sanda Klein, MD NEW Advanced Practice Provider:  No care team member to display Electrophysiologist:  None       Referring MD: Merrilee Seashore, MD   Chief Complaint  Patient presents with  . New Patient (Initial Visit)  . Shortness of Breath  . Edema    Ankles.  Brittany [name] is a 80 y.o. female who is being seen today for the evaluation of shortness of breath and edema at the request of Merrilee Seashore, MD.   History of Present Illness:    Brittany [name] is a 80 y.o. female with a hx of HTN, HLP, gout and hypothyroidism whose husband Brittany [name] is also my patient.  She is accompanied today by her son, Psychologist, forensic.  She developed shortness of breath over the last approximately 4 weeks, particularly worse over the last month.  She becomes markedly short of breath after climbing a flight of stairs.  She can walk for roughly 50 feet before she develops dyspnea, with before she would walk a circuit around her neighborhood without difficulty.  She becomes particularly short of breath when she is walking uphill.  She has noticed wheezing and minor swelling of her feet.  She is also noticed an increase in weight about 259 pounds where has her usual weight is around 245-247 pounds.  After an increased dose of furosemide, her weight has now decreased to 251 pounds and she feels better.  She denies orthopnea or PND and does not have chest discomfort either at rest or with activity.  She denies palpitations, dizziness, syncope, new focal neurological events or intermittent claudication.  She has not had any falls.    An echocardiogram was performed at [facility] which showed normal left ventricular size and systolic function with a calculated EF of 64%.  There was  "borderline wall thickness" (septum 1.2 cm, posterior 4.43 cm) and diastolic dysfunction with impaired relaxation pattern (E/A was 0.68 but deceleration time was normal and E/e' was normal at 6-7).  The left atrium was described as slightly dilated (end-systolic diameter 1.54 cm) the TR velocity was normal at 2.15 ms and there were no major valvular abnormalities described.  Her blood pressure control over the years has generally been good.  She does not have known CAD.  Her most recent LDL cholesterol was 80 with an HDL of 74.  She does not have diabetes mellitus.  She has treated hypothyroidism and her most recent TSH in December was 1.15.  She has not had any recent gout attacks and takes allopurinol and as needed colchicine.  She only takes 2 antihypertensives: Guanfacine and irbesartan.   Past Medical History:  Diagnosis Date  . Gout   . Hypercholesterolemia   . Hypertension   . Hypothyroid     No past surgical history on file.  Current Medications: Current Meds  Medication Sig  . allopurinol (ZYLOPRIM) 100 MG tablet Take 100 mg by mouth daily.  Marland Kitchen aspirin EC 81 MG tablet Take 81 mg by mouth daily.  Marland Kitchen atorvastatin (LIPITOR) 20 MG tablet Take 20 mg by mouth daily.  . colchicine 0.6 MG tablet Take 0.6 mg by mouth daily as needed (for gout flare ups).  . EPINEPHrine (EPIPEN 2-PAK) 0.3 mg/0.3 mL IJ SOAJ injection Inject 0.3 mLs (0.3 mg  total) into the muscle once.  . gabapentin (NEURONTIN) 300 MG capsule Take 1 capsule by mouth daily as needed.  Marland Kitchen guanFACINE (TENEX) 1 MG tablet Take 2 mg by mouth at bedtime.  . irbesartan (AVAPRO) 150 MG tablet Take 150 mg by mouth daily.  Marland Kitchen omalizumab Arvid Right) 150 MG/ML prefilled syringe See admin instructions.  . potassium chloride (KLOR-CON) 10 MEQ tablet Take 10 mEq by mouth daily.  Marland Kitchen SYNTHROID 125 MCG tablet Take 125 mcg by mouth daily.  . [DISCONTINUED] furosemide (LASIX) 80 MG tablet Take 80 mg by mouth daily.     Allergies:   Patient has no  known allergies.   Social History   Socioeconomic History  . Marital status: Married    Spouse name: Not on file  . Number of children: Not on file  . Years of education: Not on file  . Highest education level: Not on file  Occupational History  . Not on file  Tobacco Use  . Smoking status: Former Research scientist (life sciences)  . Smokeless tobacco: Never Used  Substance and Sexual Activity  . Alcohol use: Never  . Drug use: Not on file  . Sexual activity: Not on file  Other Topics Concern  . Not on file  Social History Narrative  . Not on file   Social Determinants of Health   Financial Resource Strain: Not on file  Food Insecurity: Not on file  Transportation Needs: Not on file  Physical Activity: Not on file  Stress: Not on file  Social Connections: Not on file     Family History: The patient's family history is negative for Breast cancer., CAD, CHF  ROS:   Please see the history of present illness.     All other systems reviewed and are negative.  EKGs/Labs/Other Studies Reviewed:    The following studies were reviewed today: Echocardiogram from 10/09/2018 Labs from 09/28/2020, including hemoglobin 12.6, BUN 23, creatinine 1.36, potassium 4.7, normal liver function tests, BNP 84 Chest x-ray PA and lateral from 09/28/2020 which showed no acute disease and the presence of aortic atherosclerosis and emphysema. CT angiogram of the chest in 2013 was performed for pulmonary embolism.  Did not show any evidence of pulmonary embolism and there was no report of coronary or aortic atherosclerosis.  EKG:  EKG is ordered today.  The ekg ordered today demonstrates normal sinus rhythm, LVH by voltage criteria only, no repolarization abnormalities.  Recent Labs: No results found for requested labs within last 8760 hours.  Recent Lipid Panel No results found for: CHOL, TRIG, HDL, CHOLHDL, VLDL, LDLCALC, LDLDIRECT   Risk Assessment/Calculations:       Physical Exam:    VS:  BP 140/84 (BP  Location: Left Arm, Patient Position: Sitting, Cuff Size: Large)   Pulse 61   Ht 5' 7.5" (1.715 m)   Wt 264 lb (119.7 kg)   BMI 40.74 kg/m     Wt Readings from Last 3 Encounters:  10/27/20 264 lb (119.7 kg)  03/08/18 250 lb (113.4 kg)     GEN: Morbidly obese, well nourished, well developed in no acute distress HEENT: Normal NECK: No JVD; No carotid bruits LYMPHATICS: No lymphadenopathy CARDIAC: RRR, no murmurs, rubs, gallops RESPIRATORY:  Clear to auscultation without rales, wheezing or rhonchi  ABDOMEN: Soft, non-tender, non-distended MUSCULOSKELETAL:  No edema; No deformity  SKIN: Warm and dry NEUROLOGIC:  Alert and oriented x 3 PSYCHIATRIC:  Normal affect   ASSESSMENT:    1. Chronic diastolic heart failure ([place])   2. Essential  hypertension   3. Stage 3a chronic kidney disease ([place])   4. History of gout   5. Morbid obesity ([place])    PLAN:    In order of problems listed above:  1. CHF: Edema, shortness of breath and wheezing have all improved with increased diuretic dosage.  This supports a diagnosis of CHF, although the BNP was quite low.  The chest x-ray did not show congestion.  The echo does show LVH and findings of relatively mild diastolic dysfunction and there is no pulmonary hypertension.  It appears that she has diastolic heart failure and a long history of hypertension appears to be reasonable assumption as to the etiology.  There are no overt findings that raise concern for amyloidosis or coronary disease.  Discussed importance of sodium restriction, daily weight monitoring, signs and symptoms of heart failure exacerbation.  Judging by her previous weights, her "dry weight" is probably around 246 pounds, definitely less than 250 pounds.  We will increase her furosemide temporarily to 120 mg and she can adjust the dose between 80 and 120 mg depending on whether she is below or above her "dry weight". 2. HTN: Blood pressure is reasonably well controlled for an  octogenarian.  Consider switching from guanfacine to a conventional beta-blocker if necessary.  Continue ARB and furosemide. 3. CKD 3: Monitor renal function and potassium level with enhanced diuresis. 4. History of gout: Watch for exacerbation with increased diuretic use. 5. Morbid obesity: Encourage weight loss. 6. Wheezing: This was probably cardiac asthma.  Note that she had normal pulmonary function test in 2017.  FEV1 was 2 L (102% of predicted) and did not change with bronchodilators.        Medication Adjustments/Labs and Tests Ordered: Current medicines are reviewed at length with the patient today.  Concerns regarding medicines are outlined above.  Orders Placed This Encounter  Procedures  . EKG 12-Lead   Meds ordered this encounter  Medications  . furosemide (LASIX) 80 MG tablet    Sig: Take one 80 mg tablet daily for a weight less than 250 lbs. Take 120 mg for a weight over 250 lbs    Dispense:  40 tablet    Refill:  3    Patient Instructions  Medication Instructions:  FUROSEMIDE: Take 80 mg once daily for a weight less than 250 lbs and take 120 mg for a weight over 250 lbs.  *If you need a refill on your cardiac medications before your next appointment, please call your pharmacy*   Lab Work: None ordered If you have labs (blood work) drawn today and your tests are completely normal, you will receive your results only by: Marland Kitchen MyChart Message (if you have MyChart) OR . A paper copy in the mail If you have any lab test that is abnormal or we need to change your treatment, we will call you to review the results.   Testing/Procedures: None ordered   Follow-Up: At [facility], you and your health needs are our priority.  As part of our continuing mission to provide you with exceptional heart care, we have created designated Provider Care Teams.  These Care Teams include your primary Cardiologist (physician) and Advanced Practice Providers (APPs -  Physician  Assistants and Nurse Practitioners) who all work together to provide you with the care you need, when you need it.  We recommend signing up for the patient portal called "MyChart".  Sign up information is provided on this After Visit Summary.  MyChart is used to  connect with patients for Virtual Visits (Telemedicine).  Patients are able to view lab/test results, encounter notes, upcoming appointments, etc.  Non-urgent messages can be sent to your provider as well.   To learn more about what you can do with MyChart, go to [email or web address].    Your next appointment:   4 month(s)  The format for your next appointment:   In Person  Provider:   You may see Sanda Klein, MD or one of the following Advanced Practice Providers on your designated Care Team:    Almyra Deforest, PA-C  Fabian Sharp, [place] or   Roby Lofts, PA-C       Signed, Sanda Klein, MD  10/30/2020 8:46 PM    [place]

## 2020-10-29 DIAGNOSIS — L501 Idiopathic urticaria: Secondary | ICD-10-CM | POA: Diagnosis not present

## 2020-10-30 ENCOUNTER — Telehealth: Payer: Self-pay | Admitting: Cardiovascular Disease

## 2020-10-30 ENCOUNTER — Encounter: Payer: Self-pay | Admitting: Cardiovascular Disease

## 2020-10-30 DIAGNOSIS — Z8739 Personal history of other diseases of the musculoskeletal system and connective tissue: Secondary | ICD-10-CM | POA: Insufficient documentation

## 2020-10-30 DIAGNOSIS — N1831 Chronic kidney disease, stage 3a: Secondary | ICD-10-CM | POA: Insufficient documentation

## 2020-10-30 DIAGNOSIS — I5032 Chronic diastolic (congestive) heart failure: Secondary | ICD-10-CM | POA: Insufficient documentation

## 2020-10-30 DIAGNOSIS — I10 Essential (primary) hypertension: Secondary | ICD-10-CM | POA: Insufficient documentation

## 2020-10-30 NOTE — Telephone Encounter (Signed)
Patient states her pcp sent her echo results to the office on 10/28/20 and she is calling to ensure they they have been received.

## 2020-10-30 NOTE — Telephone Encounter (Signed)
Spoke to patient. Informed patient do not see a copy of echo report. Patient states she had echo done at primary's office. Primary has someone come to the office to echo for them.  RN called primary's office Dr Isidore Moos but they are closed today . patient is aware will send to Dr Lurline Del nurse so she can keep a look out for it

## 2020-11-02 NOTE — Telephone Encounter (Signed)
Patient has been made aware that the results have been received.

## 2020-12-01 DIAGNOSIS — L501 Idiopathic urticaria: Secondary | ICD-10-CM | POA: Diagnosis not present

## 2020-12-09 DIAGNOSIS — Z6839 Body mass index (BMI) 39.0-39.9, adult: Secondary | ICD-10-CM | POA: Diagnosis not present

## 2020-12-09 DIAGNOSIS — Z01419 Encounter for gynecological examination (general) (routine) without abnormal findings: Secondary | ICD-10-CM | POA: Diagnosis not present

## 2020-12-30 DIAGNOSIS — L501 Idiopathic urticaria: Secondary | ICD-10-CM | POA: Diagnosis not present

## 2021-01-11 DIAGNOSIS — E782 Mixed hyperlipidemia: Secondary | ICD-10-CM | POA: Diagnosis not present

## 2021-01-11 DIAGNOSIS — I10 Essential (primary) hypertension: Secondary | ICD-10-CM | POA: Diagnosis not present

## 2021-01-20 DIAGNOSIS — I10 Essential (primary) hypertension: Secondary | ICD-10-CM | POA: Diagnosis not present

## 2021-01-20 DIAGNOSIS — L501 Idiopathic urticaria: Secondary | ICD-10-CM | POA: Diagnosis not present

## 2021-01-20 DIAGNOSIS — M150 Primary generalized (osteo)arthritis: Secondary | ICD-10-CM | POA: Diagnosis not present

## 2021-01-20 DIAGNOSIS — R600 Localized edema: Secondary | ICD-10-CM | POA: Diagnosis not present

## 2021-01-20 DIAGNOSIS — E890 Postprocedural hypothyroidism: Secondary | ICD-10-CM | POA: Diagnosis not present

## 2021-01-20 DIAGNOSIS — N1831 Chronic kidney disease, stage 3a: Secondary | ICD-10-CM | POA: Diagnosis not present

## 2021-01-20 DIAGNOSIS — E782 Mixed hyperlipidemia: Secondary | ICD-10-CM | POA: Diagnosis not present

## 2021-01-20 DIAGNOSIS — I129 Hypertensive chronic kidney disease with stage 1 through stage 4 chronic kidney disease, or unspecified chronic kidney disease: Secondary | ICD-10-CM | POA: Diagnosis not present

## 2021-01-20 DIAGNOSIS — M1A0790 Idiopathic chronic gout, unspecified ankle and foot, without tophus (tophi): Secondary | ICD-10-CM | POA: Diagnosis not present

## 2021-01-28 DIAGNOSIS — H02403 Unspecified ptosis of bilateral eyelids: Secondary | ICD-10-CM | POA: Diagnosis not present

## 2021-01-28 DIAGNOSIS — H5213 Myopia, bilateral: Secondary | ICD-10-CM | POA: Diagnosis not present

## 2021-01-28 DIAGNOSIS — L501 Idiopathic urticaria: Secondary | ICD-10-CM | POA: Diagnosis not present

## 2021-01-28 DIAGNOSIS — H26493 Other secondary cataract, bilateral: Secondary | ICD-10-CM | POA: Diagnosis not present

## 2021-01-28 DIAGNOSIS — Z961 Presence of intraocular lens: Secondary | ICD-10-CM | POA: Diagnosis not present

## 2021-02-01 DIAGNOSIS — Z23 Encounter for immunization: Secondary | ICD-10-CM | POA: Diagnosis not present

## 2021-02-17 ENCOUNTER — Other Ambulatory Visit: Payer: Self-pay

## 2021-02-17 ENCOUNTER — Encounter: Payer: Self-pay | Admitting: Cardiovascular Disease

## 2021-02-17 ENCOUNTER — Ambulatory Visit (INDEPENDENT_AMBULATORY_CARE_PROVIDER_SITE_OTHER): Payer: Medicare Other | Admitting: Cardiovascular Disease

## 2021-02-17 VITALS — BP 138/76 | HR 68 | Ht 67.5 in | Wt 260.0 lb

## 2021-02-17 DIAGNOSIS — Z8739 Personal history of other diseases of the musculoskeletal system and connective tissue: Secondary | ICD-10-CM

## 2021-02-17 DIAGNOSIS — N1832 Chronic kidney disease, stage 3b: Secondary | ICD-10-CM | POA: Diagnosis not present

## 2021-02-17 DIAGNOSIS — I10 Essential (primary) hypertension: Secondary | ICD-10-CM | POA: Diagnosis not present

## 2021-02-17 DIAGNOSIS — I5032 Chronic diastolic (congestive) heart failure: Secondary | ICD-10-CM | POA: Diagnosis not present

## 2021-02-17 NOTE — Progress Notes (Signed)
Cardiology Office Note:    Date:  02/17/2021   ID:  Raeonna, Milo [birth year], MRN [id number]  PCP:  Merrilee Seashore, [place]  Cardiologist:  Sanda Klein, MD  Advanced Practice Provider:  No care team member to display Electrophysiologist:  None       Referring MD: Merrilee Seashore, MD   Chief Complaint  Patient presents with   Follow-up    4 months.   Congestive Heart Failure   History of Present Illness:    Brittany [name] is a 80 y.o. female with a hx of chronic diastolic heart failure, HTN, HLP, gout and hypothyroidism whose husband Brittany [name] is also my patient (they both have appointments today).  She has done well since her last appointment and has not had any more problems with shortness of breath.  She can walk around her neighborhood without difficulty and climb a flight of stairs without becoming winded.  She does not have cough, wheezing, orthopnea or PND.  She thinks she still has some trivial ankle swelling, but I do not really see any today.  She has not had any attacks of gout.  She denies palpitations, dizziness or syncope.  She has not complained of chest pain at rest or with activity and does not have claudication or focal neurological complaints.  She is troubled by the fact that the furosemide does not necessarily cause a lot of urine output and wonders if she should try different medication.  Previously we have estimated that her dry weight is around 250 pounds, but today she weighs 260 pounds and seems to be reasonably well compensated.  An echocardiogram was performed at [facility] which showed normal left ventricular size and systolic function with a calculated EF of 64%.  There was "borderline wall thickness" (septum 1.2 cm, posterior 2.72 cm) and diastolic dysfunction with impaired relaxation pattern (E/A was 0.68 but deceleration time was normal and E/e' was normal at 6-7).  The left  atrium was described as slightly dilated (end-systolic diameter 5.36 cm) the TR velocity was normal at 2.15 ms and there were no major valvular abnormalities described.  Her blood pressure control over the years has generally been good.  She does not have known CAD.  Her lipid profile is quite favorable on atorvastatin, HDL 63, LDL 70.  She does not have diabetes mellitus.  She has treated hypothyroidism and her most recent TSH in June 2022 was 1.98 she has not had any recent gout attacks and takes allopurinol and as needed colchicine.  She only takes 2 antihypertensives: Guanfacine and irbesartan.   Past Medical History:  Diagnosis Date   Gout    Hypercholesterolemia    Hypertension    Hypothyroid     History reviewed. No pertinent surgical history.  Current Medications: Current Meds  Medication Sig   allopurinol (ZYLOPRIM) 100 MG tablet Take 100 mg by mouth daily.   aspirin EC 81 MG tablet Take 81 mg by mouth daily.   atorvastatin (LIPITOR) 20 MG tablet Take 20 mg by mouth daily.   colchicine 0.6 MG tablet Take 0.6 mg by mouth daily as needed (for gout flare ups).   EPINEPHrine (EPIPEN 2-PAK) 0.3 mg/0.3 mL IJ SOAJ injection Inject 0.3 mLs (0.3 mg total) into the muscle once.   furosemide (LASIX) 80 MG tablet Take one 80 mg tablet daily for a weight less than 250 lbs. Take 120 mg for a weight over 250 lbs   gabapentin (  NEURONTIN) 300 MG capsule Take 1 capsule by mouth daily as needed.   guanFACINE (TENEX) 1 MG tablet Take 2 mg by mouth at bedtime.   irbesartan (AVAPRO) 150 MG tablet Take 150 mg by mouth daily.   omalizumab Arvid Right) 150 MG/ML prefilled syringe See admin instructions.   potassium chloride (KLOR-CON) 10 MEQ tablet Take 10 mEq by mouth daily.   SYNTHROID 125 MCG tablet Take 125 mcg by mouth daily.     Allergies:   Patient has no known allergies.   Social History   Socioeconomic History   Marital status: Married    Spouse name: Not on file   Number of children: Not  on file   Years of education: Not on file   Highest education level: Not on file  Occupational History   Not on file  Tobacco Use   Smoking status: Former   Smokeless tobacco: Never  Substance and Sexual Activity   Alcohol use: Never   Drug use: Not on file   Sexual activity: Not on file  Other Topics Concern   Not on file  Social History Narrative   Not on file   Social Determinants of Health   Financial Resource Strain: Not on file  Food Insecurity: Not on file  Transportation Needs: Not on file  Physical Activity: Not on file  Stress: Not on file  Social Connections: Not on file     Family History: The patient's family history is negative for Breast cancer., CAD, CHF  ROS:   Please see the history of present illness.     All other systems reviewed and are negative.  EKGs/Labs/Other Studies Reviewed:    The following studies were reviewed today: Echocardiogram from 10/09/2018   Labs from 09/28/2020, including hemoglobin 12.6, BUN 23, creatinine 1.36, potassium 4.7, normal liver function tests, BNP 84 Chest x-ray PA and lateral from 09/28/2020 which showed no acute disease and the presence of aortic atherosclerosis and emphysema. CT angiogram of the chest in 2013 was performed for pulmonary embolism.  Did not show any evidence of pulmonary embolism and there was no report of coronary or aortic atherosclerosis.  EKG:  EKG is not ordered today.  The ekg ordered at her previous appointment demonstrates normal sinus rhythm, LVH by voltage criteria only, no repolarization abnormalities.  Recent Labs: No results found for requested labs within last 8760 hours.  Recent Lipid Panel No results found for: CHOL, TRIG, HDL, CHOLHDL, VLDL, LDLCALC, LDLDIRECT Labs from 01/11/2021 Cholesterol 144, HDL 63, triglycerides 78  Risk Assessment/Calculations:       Physical Exam:    VS:  BP 138/76 (BP Location: Left Arm, Patient Position: Sitting, Cuff Size: Large)   Pulse 68    Ht 5' 7.5" (1.715 m)   Wt 260 lb (117.9 kg)   BMI 40.12 kg/m     Wt Readings from Last 3 Encounters:  02/17/21 260 lb (117.9 kg)  10/27/20 264 lb (119.7 kg)  03/08/18 250 lb (113.4 kg)     General: Alert, oriented x3, no distress, morbidly obese Head: no evidence of trauma, PERRL, EOMI, no exophtalmos or lid lag, no myxedema, no xanthelasma; normal ears, nose and oropharynx Neck: normal jugular venous pulsations and no hepatojugular reflux; brisk carotid pulses without delay and no carotid bruits Chest: clear to auscultation, no signs of consolidation by percussion or palpation, normal fremitus, symmetrical and full respiratory excursions Cardiovascular: normal position and quality of the apical impulse, regular rhythm, normal first and second heart sounds, no murmurs,  rubs or gallops Abdomen: no tenderness or distention, no masses by palpation, no abnormal pulsatility or arterial bruits, normal bowel sounds, no hepatosplenomegaly Extremities: no clubbing, cyanosis or edema; 2+ radial, ulnar and brachial pulses bilaterally; 2+ right femoral, posterior tibial and dorsalis pedis pulses; 2+ left femoral, posterior tibial and dorsalis pedis pulses; no subclavian or femoral bruits Neurological: grossly nonfocal Psych: Normal mood and affect   ASSESSMENT:    1. Chronic diastolic heart failure ([place])   2. Essential hypertension   3. Stage 3b chronic kidney disease ([place])   4. History of gout   5. Morbid obesity ([place])     PLAN:    In order of problems listed above:  CHF: She appears to be euvolemic clinically and does not have overt findings of fluid overload on physical exam.  I suspect that she may have gained back some real weight.  It seems to me that the current diuretic regimen is working well for her and I do not think we need to switch to torsemide.  She does have moderate chronic kidney disease and gout and we should avoid excessive diuresis.  Reviewed again the importance of sodium  restriction, "true" weight loss, regular exercise.   HTN: Blood pressure is in acceptable range for an octogenarian.  Consider switching from guanfacine to a conventional beta-blocker if necessary.  Continue ARB and furosemide. CKD 3: Monitor renal function and potassium level periodically. History of gout: No exacerbation with a higher dose of diuretic. Morbid obesity: Encourage weight loss. Wheezing: This was probably cardiac asthma.  Note that she had normal pulmonary function test in 2017.  FEV1 was 2 L (102% of predicted) and did not change with bronchodilators.        Medication Adjustments/Labs and Tests Ordered: Current medicines are reviewed at length with the patient today.  Concerns regarding medicines are outlined above.  No orders of the defined types were placed in this encounter.  No orders of the defined types were placed in this encounter.   Patient Instructions  Medication Instructions:  No changes *If you need a refill on your cardiac medications before your next appointment, please call your pharmacy*   Lab Work: None ordered If you have labs (blood work) drawn today and your tests are completely normal, you will receive your results only by: [place] (if you have MyChart) OR A paper copy in the mail If you have any lab test that is abnormal or we need to change your treatment, we will call you to review the results.   Testing/Procedures: None ordered   Follow-Up: At [facility], you and your health needs are our priority.  As part of our continuing mission to provide you with exceptional heart care, we have created designated Provider Care Teams.  These Care Teams include your primary Cardiologist (physician) and Advanced Practice Providers (APPs -  Physician Assistants and Nurse Practitioners) who all work together to provide you with the care you need, when you need it.  We recommend signing up for the patient portal called "MyChart".  Sign up  information is provided on this After Visit Summary.  MyChart is used to connect with patients for Virtual Visits (Telemedicine).  Patients are able to view lab/test results, encounter notes, upcoming appointments, etc.  Non-urgent messages can be sent to your provider as well.   To learn more about what you can do with MyChart, go to [email or web address].    Your next appointment:   12 month(s)  The format for  your next appointment:   In Person  Provider:   You may see Sanda Klein, MD or one of the following Advanced Practice Providers on your designated Care Team:   Almyra Deforest, PA-C Fabian Sharp, [place] or  Roby Lofts, PA-C     Signed, Sanda Klein, MD  02/17/2021 9:22 AM    [place]

## 2021-02-17 NOTE — Patient Instructions (Signed)

## 2021-02-24 DIAGNOSIS — L501 Idiopathic urticaria: Secondary | ICD-10-CM | POA: Diagnosis not present

## 2021-03-17 DIAGNOSIS — N1831 Chronic kidney disease, stage 3a: Secondary | ICD-10-CM | POA: Diagnosis not present

## 2021-03-17 DIAGNOSIS — E782 Mixed hyperlipidemia: Secondary | ICD-10-CM | POA: Diagnosis not present

## 2021-03-17 DIAGNOSIS — I129 Hypertensive chronic kidney disease with stage 1 through stage 4 chronic kidney disease, or unspecified chronic kidney disease: Secondary | ICD-10-CM | POA: Diagnosis not present

## 2021-04-01 DIAGNOSIS — L501 Idiopathic urticaria: Secondary | ICD-10-CM | POA: Diagnosis not present

## 2021-04-28 DIAGNOSIS — L501 Idiopathic urticaria: Secondary | ICD-10-CM | POA: Diagnosis not present

## 2021-05-05 DIAGNOSIS — Z23 Encounter for immunization: Secondary | ICD-10-CM | POA: Diagnosis not present

## 2021-05-10 DIAGNOSIS — Z23 Encounter for immunization: Secondary | ICD-10-CM | POA: Diagnosis not present

## 2021-05-17 DIAGNOSIS — I129 Hypertensive chronic kidney disease with stage 1 through stage 4 chronic kidney disease, or unspecified chronic kidney disease: Secondary | ICD-10-CM | POA: Diagnosis not present

## 2021-05-17 DIAGNOSIS — E782 Mixed hyperlipidemia: Secondary | ICD-10-CM | POA: Diagnosis not present

## 2021-05-17 DIAGNOSIS — N1831 Chronic kidney disease, stage 3a: Secondary | ICD-10-CM | POA: Diagnosis not present

## 2021-05-25 ENCOUNTER — Other Ambulatory Visit: Payer: Self-pay | Admitting: Internal Medicine

## 2021-05-25 DIAGNOSIS — Z1231 Encounter for screening mammogram for malignant neoplasm of breast: Secondary | ICD-10-CM

## 2021-06-04 DIAGNOSIS — L501 Idiopathic urticaria: Secondary | ICD-10-CM | POA: Diagnosis not present

## 2021-07-01 ENCOUNTER — Ambulatory Visit
Admission: RE | Admit: 2021-07-01 | Discharge: 2021-07-01 | Disposition: A | Discharge status: Home / Self Care | Payer: Medicare Other | Source: Ambulatory Visit | Attending: Internal Medicine | Admitting: Internal Medicine

## 2021-07-01 ENCOUNTER — Other Ambulatory Visit: Payer: Self-pay

## 2021-07-01 DIAGNOSIS — L501 Idiopathic urticaria: Secondary | ICD-10-CM | POA: Diagnosis not present

## 2021-07-01 DIAGNOSIS — Z1231 Encounter for screening mammogram for malignant neoplasm of breast: Secondary | ICD-10-CM | POA: Diagnosis not present

## 2021-07-28 DIAGNOSIS — E782 Mixed hyperlipidemia: Secondary | ICD-10-CM | POA: Diagnosis not present

## 2021-07-28 DIAGNOSIS — I129 Hypertensive chronic kidney disease with stage 1 through stage 4 chronic kidney disease, or unspecified chronic kidney disease: Secondary | ICD-10-CM | POA: Diagnosis not present

## 2021-07-28 DIAGNOSIS — E890 Postprocedural hypothyroidism: Secondary | ICD-10-CM | POA: Diagnosis not present

## 2021-07-28 DIAGNOSIS — N1831 Chronic kidney disease, stage 3a: Secondary | ICD-10-CM | POA: Diagnosis not present

## 2021-07-30 DIAGNOSIS — L501 Idiopathic urticaria: Secondary | ICD-10-CM | POA: Diagnosis not present

## 2021-08-04 DIAGNOSIS — L501 Idiopathic urticaria: Secondary | ICD-10-CM | POA: Diagnosis not present

## 2021-08-04 DIAGNOSIS — E890 Postprocedural hypothyroidism: Secondary | ICD-10-CM | POA: Diagnosis not present

## 2021-08-04 DIAGNOSIS — E782 Mixed hyperlipidemia: Secondary | ICD-10-CM | POA: Diagnosis not present

## 2021-08-04 DIAGNOSIS — M150 Primary generalized (osteo)arthritis: Secondary | ICD-10-CM | POA: Diagnosis not present

## 2021-08-04 DIAGNOSIS — M1A0790 Idiopathic chronic gout, unspecified ankle and foot, without tophus (tophi): Secondary | ICD-10-CM | POA: Diagnosis not present

## 2021-08-04 DIAGNOSIS — I10 Essential (primary) hypertension: Secondary | ICD-10-CM | POA: Diagnosis not present

## 2021-08-04 DIAGNOSIS — I129 Hypertensive chronic kidney disease with stage 1 through stage 4 chronic kidney disease, or unspecified chronic kidney disease: Secondary | ICD-10-CM | POA: Diagnosis not present

## 2021-08-04 DIAGNOSIS — Z0000 Encounter for general adult medical examination without abnormal findings: Secondary | ICD-10-CM | POA: Diagnosis not present

## 2021-08-04 DIAGNOSIS — I5032 Chronic diastolic (congestive) heart failure: Secondary | ICD-10-CM | POA: Diagnosis not present

## 2021-08-04 DIAGNOSIS — R600 Localized edema: Secondary | ICD-10-CM | POA: Diagnosis not present

## 2021-08-04 DIAGNOSIS — N1832 Chronic kidney disease, stage 3b: Secondary | ICD-10-CM | POA: Diagnosis not present

## 2021-09-01 DIAGNOSIS — L501 Idiopathic urticaria: Secondary | ICD-10-CM | POA: Diagnosis not present

## 2021-09-29 DIAGNOSIS — L501 Idiopathic urticaria: Secondary | ICD-10-CM | POA: Diagnosis not present

## 2021-10-14 DIAGNOSIS — I5032 Chronic diastolic (congestive) heart failure: Secondary | ICD-10-CM | POA: Diagnosis not present

## 2021-10-14 DIAGNOSIS — R2681 Unsteadiness on feet: Secondary | ICD-10-CM | POA: Diagnosis not present

## 2021-10-14 DIAGNOSIS — R269 Unspecified abnormalities of gait and mobility: Secondary | ICD-10-CM | POA: Diagnosis not present

## 2021-10-14 DIAGNOSIS — R5383 Other fatigue: Secondary | ICD-10-CM | POA: Diagnosis not present

## 2021-10-29 DIAGNOSIS — L501 Idiopathic urticaria: Secondary | ICD-10-CM | POA: Diagnosis not present

## 2021-11-03 DIAGNOSIS — M6281 Muscle weakness (generalized): Secondary | ICD-10-CM | POA: Diagnosis not present

## 2021-11-03 DIAGNOSIS — R262 Difficulty in walking, not elsewhere classified: Secondary | ICD-10-CM | POA: Diagnosis not present

## 2021-11-09 DIAGNOSIS — M6281 Muscle weakness (generalized): Secondary | ICD-10-CM | POA: Diagnosis not present

## 2021-11-09 DIAGNOSIS — R262 Difficulty in walking, not elsewhere classified: Secondary | ICD-10-CM | POA: Diagnosis not present

## 2021-11-11 DIAGNOSIS — R262 Difficulty in walking, not elsewhere classified: Secondary | ICD-10-CM | POA: Diagnosis not present

## 2021-11-11 DIAGNOSIS — M6281 Muscle weakness (generalized): Secondary | ICD-10-CM | POA: Diagnosis not present

## 2021-11-15 DIAGNOSIS — M6281 Muscle weakness (generalized): Secondary | ICD-10-CM | POA: Diagnosis not present

## 2021-11-15 DIAGNOSIS — R262 Difficulty in walking, not elsewhere classified: Secondary | ICD-10-CM | POA: Diagnosis not present

## 2021-11-17 DIAGNOSIS — R262 Difficulty in walking, not elsewhere classified: Secondary | ICD-10-CM | POA: Diagnosis not present

## 2021-11-17 DIAGNOSIS — M6281 Muscle weakness (generalized): Secondary | ICD-10-CM | POA: Diagnosis not present

## 2021-11-22 DIAGNOSIS — R262 Difficulty in walking, not elsewhere classified: Secondary | ICD-10-CM | POA: Diagnosis not present

## 2021-11-22 DIAGNOSIS — M6281 Muscle weakness (generalized): Secondary | ICD-10-CM | POA: Diagnosis not present

## 2021-11-24 DIAGNOSIS — M6281 Muscle weakness (generalized): Secondary | ICD-10-CM | POA: Diagnosis not present

## 2021-11-24 DIAGNOSIS — R262 Difficulty in walking, not elsewhere classified: Secondary | ICD-10-CM | POA: Diagnosis not present

## 2021-11-29 DIAGNOSIS — L501 Idiopathic urticaria: Secondary | ICD-10-CM | POA: Diagnosis not present

## 2021-11-29 DIAGNOSIS — R262 Difficulty in walking, not elsewhere classified: Secondary | ICD-10-CM | POA: Diagnosis not present

## 2021-11-29 DIAGNOSIS — M6281 Muscle weakness (generalized): Secondary | ICD-10-CM | POA: Diagnosis not present

## 2021-12-01 DIAGNOSIS — M6281 Muscle weakness (generalized): Secondary | ICD-10-CM | POA: Diagnosis not present

## 2021-12-01 DIAGNOSIS — R262 Difficulty in walking, not elsewhere classified: Secondary | ICD-10-CM | POA: Diagnosis not present

## 2021-12-15 DIAGNOSIS — I129 Hypertensive chronic kidney disease with stage 1 through stage 4 chronic kidney disease, or unspecified chronic kidney disease: Secondary | ICD-10-CM | POA: Diagnosis not present

## 2021-12-15 DIAGNOSIS — N1832 Chronic kidney disease, stage 3b: Secondary | ICD-10-CM | POA: Diagnosis not present

## 2021-12-15 DIAGNOSIS — E782 Mixed hyperlipidemia: Secondary | ICD-10-CM | POA: Diagnosis not present

## 2021-12-15 DIAGNOSIS — I10 Essential (primary) hypertension: Secondary | ICD-10-CM | POA: Diagnosis not present

## 2021-12-29 DIAGNOSIS — L501 Idiopathic urticaria: Secondary | ICD-10-CM | POA: Diagnosis not present

## 2022-01-28 DIAGNOSIS — L501 Idiopathic urticaria: Secondary | ICD-10-CM | POA: Diagnosis not present

## 2022-02-02 DIAGNOSIS — N1832 Chronic kidney disease, stage 3b: Secondary | ICD-10-CM | POA: Diagnosis not present

## 2022-02-02 DIAGNOSIS — I5032 Chronic diastolic (congestive) heart failure: Secondary | ICD-10-CM | POA: Diagnosis not present

## 2022-02-02 DIAGNOSIS — E782 Mixed hyperlipidemia: Secondary | ICD-10-CM | POA: Diagnosis not present

## 2022-02-02 DIAGNOSIS — I129 Hypertensive chronic kidney disease with stage 1 through stage 4 chronic kidney disease, or unspecified chronic kidney disease: Secondary | ICD-10-CM | POA: Diagnosis not present

## 2022-02-02 DIAGNOSIS — M150 Primary generalized (osteo)arthritis: Secondary | ICD-10-CM | POA: Diagnosis not present

## 2022-02-02 DIAGNOSIS — E890 Postprocedural hypothyroidism: Secondary | ICD-10-CM | POA: Diagnosis not present

## 2022-02-02 DIAGNOSIS — M1A0790 Idiopathic chronic gout, unspecified ankle and foot, without tophus (tophi): Secondary | ICD-10-CM | POA: Diagnosis not present

## 2022-02-09 DIAGNOSIS — E782 Mixed hyperlipidemia: Secondary | ICD-10-CM | POA: Diagnosis not present

## 2022-02-09 DIAGNOSIS — E890 Postprocedural hypothyroidism: Secondary | ICD-10-CM | POA: Diagnosis not present

## 2022-02-09 DIAGNOSIS — L501 Idiopathic urticaria: Secondary | ICD-10-CM | POA: Diagnosis not present

## 2022-02-09 DIAGNOSIS — I5032 Chronic diastolic (congestive) heart failure: Secondary | ICD-10-CM | POA: Diagnosis not present

## 2022-02-09 DIAGNOSIS — R600 Localized edema: Secondary | ICD-10-CM | POA: Diagnosis not present

## 2022-02-09 DIAGNOSIS — N1832 Chronic kidney disease, stage 3b: Secondary | ICD-10-CM | POA: Diagnosis not present

## 2022-02-09 DIAGNOSIS — I129 Hypertensive chronic kidney disease with stage 1 through stage 4 chronic kidney disease, or unspecified chronic kidney disease: Secondary | ICD-10-CM | POA: Diagnosis not present

## 2022-02-09 DIAGNOSIS — M150 Primary generalized (osteo)arthritis: Secondary | ICD-10-CM | POA: Diagnosis not present

## 2022-02-09 DIAGNOSIS — I10 Essential (primary) hypertension: Secondary | ICD-10-CM | POA: Diagnosis not present

## 2022-02-09 DIAGNOSIS — M1A0790 Idiopathic chronic gout, unspecified ankle and foot, without tophus (tophi): Secondary | ICD-10-CM | POA: Diagnosis not present

## 2022-02-10 DIAGNOSIS — Z01419 Encounter for gynecological examination (general) (routine) without abnormal findings: Secondary | ICD-10-CM | POA: Diagnosis not present

## 2022-02-10 DIAGNOSIS — Z6839 Body mass index (BMI) 39.0-39.9, adult: Secondary | ICD-10-CM | POA: Diagnosis not present

## 2022-02-25 ENCOUNTER — Encounter: Payer: Self-pay | Admitting: Cardiovascular Disease

## 2022-02-25 ENCOUNTER — Ambulatory Visit (INDEPENDENT_AMBULATORY_CARE_PROVIDER_SITE_OTHER): Payer: Medicare Other | Admitting: Cardiovascular Disease

## 2022-02-25 VITALS — BP 146/72 | HR 57 | Ht 66.0 in | Wt 243.4 lb

## 2022-02-25 DIAGNOSIS — I10 Essential (primary) hypertension: Secondary | ICD-10-CM | POA: Diagnosis not present

## 2022-02-25 DIAGNOSIS — Z8739 Personal history of other diseases of the musculoskeletal system and connective tissue: Secondary | ICD-10-CM | POA: Diagnosis not present

## 2022-02-25 DIAGNOSIS — I5032 Chronic diastolic (congestive) heart failure: Secondary | ICD-10-CM | POA: Diagnosis not present

## 2022-02-25 DIAGNOSIS — N1832 Chronic kidney disease, stage 3b: Secondary | ICD-10-CM | POA: Diagnosis not present

## 2022-02-25 MED ORDER — FUROSEMIDE 40 MG PO TABS: 40.0000 mg | ORAL_TABLET | Freq: Every day | ORAL | 3 refills | Status: DC

## 2022-02-25 NOTE — Progress Notes (Signed)
Cardiology Office Note:    Date:  02/25/2022   ID:  Brittany [name], Brittany [name] 1941/06/18, MRN [id number]  PCP:  Brittany [name], Brittany [name]  Cardiologist:  Brittany Klein, MD  Advanced Practice Provider:  No care team member to display Electrophysiologist:  None       Referring MD: Brittany Seashore, MD   Chief Complaint  Patient presents with   Congestive Heart Failure   History of Present Illness:    Brittany [name] is a 81 y.o. female with a hx of chronic diastolic heart failure, HTN, HLP, gout and hypothyroidism whose husband Brittany [name] is also my patient (they both have appointments today).  She has not had problems with worsening shortness of breath, but continues to have NYHA functional class II exertional dyspnea.  Walking across the parking lot at church will have her huffing and puffing a little bit.  She still walks around the neighborhood with one of her friends on a regular basis, except when the weather is very hot.  When she walks with her husband, he has difficulty keeping up with her.  She does not have orthopnea, PND or ankle swelling.  She has had increasing frequency of gout attacks recently and Dr. Ashby [name] expressed some concern about her most recent creatinine which had increased to 1.78 (previously 1.3-1.4) and her uric acid increased to 8.9 (she is on renal dose allopurinol 100 mg once daily).  Previously, when she had congestive heart failure she had wheezing.  This has not happened in a long time.  She weighs 243 pounds today, well below her previous estimated dry weight of 250-260 pounds.  Blood pressure remains well-controlled (a little high when she checked in today, but improved when I rechecked it several minutes later.   An echocardiogram was performed at [name] which showed normal left ventricular size and systolic function with a calculated EF of 64%.  There was "borderline wall thickness"  (septum 1.2 cm, posterior 5.80 cm) and diastolic dysfunction  with impaired relaxation pattern (E/A was 0.68 but deceleration time was normal and E/e' was normal at 6-7).  The left atrium was described as slightly dilated (end-systolic diameter 9.98 cm) the TR velocity was normal at 2.15 ms and there were no major valvular abnormalities described.    Past Medical History:  Diagnosis Date   Gout    Hypercholesterolemia    Hypertension    Hypothyroid     History reviewed. No pertinent surgical history.  Current Medications: Current Meds  Medication Sig   allopurinol (ZYLOPRIM) 100 MG tablet Take 100 mg by mouth daily.   aspirin EC 81 MG tablet Take 81 mg by mouth daily.   atorvastatin (LIPITOR) 20 MG tablet Take 20 mg by mouth daily.   gabapentin (NEURONTIN) 300 MG capsule Take 1 capsule by mouth daily as needed.   guanFACINE (TENEX) 1 MG tablet Take 2 mg by mouth at bedtime.   irbesartan (AVAPRO) 150 MG tablet Take 150 mg by mouth daily.   omalizumab Arvid Right) 150 MG/ML prefilled syringe See admin instructions.   potassium chloride (KLOR-CON) 10 MEQ tablet Take 10 mEq by mouth daily.   SYNTHROID 125 MCG tablet Take 125 mcg by mouth daily.   [DISCONTINUED] furosemide (LASIX) 80 MG tablet Take one 80 mg tablet daily for a weight less than 250 lbs. Take 120 mg for a weight over 250 lbs     Allergies:   Patient has no known allergies.  Social History   Socioeconomic History   Marital status: Married    Spouse name: Not on file   Number of children: Not on file   Years of education: Not on file   Highest education level: Not on file  Occupational History   Not on file  Tobacco Use   Smoking status: Former   Smokeless tobacco: Never  Substance and Sexual Activity   Alcohol use: Never   Drug use: Not on file   Sexual activity: Not on file  Other Topics Concern   Not on file  Social History Narrative   Not on file   Social Determinants of Health   Financial Resource  Strain: Not on file  Food Insecurity: Not on file  Transportation Needs: Not on file  Physical Activity: Not on file  Stress: Not on file  Social Connections: Not on file     Family History: The patient's family history is negative for Breast cancer., CAD, CHF  ROS:   Please see the history of present illness.     All other systems reviewed and are negative.  EKGs/Labs/Other Studies Reviewed:    The following studies were reviewed today: Echocardiogram from 10/09/2018   Labs from 09/28/2020, including hemoglobin 12.6, BUN 23, creatinine 1.36, potassium 4.7, normal liver function tests, BNP 84 Chest x-ray PA and lateral from 09/28/2020 which showed no acute disease and the presence of aortic atherosclerosis and emphysema. CT angiogram of the chest in 2013 was performed for pulmonary embolism.  Did not show any evidence of pulmonary embolism and there was no report of coronary or aortic atherosclerosis.  EKG:  EKG is not ordered today.  The ekg ordered at her previous appointment demonstrates normal sinus rhythm, LVH by voltage criteria only, no repolarization abnormalities.  Recent Labs: No results found for requested labs within last 365 days.  Labs from Dr. Ashby [name] 02/02/2022 Creatinine 1.78, BUN 32, potassium 4.2, normal liver function tests, uric acid 8.9 Hemoglobin 13.6, TSH 0.8  Recent Lipid Panel No results found for: "CHOL", "TRIG", "HDL", "CHOLHDL", "VLDL", "[place]", "LDLDIRECT" Labs from 01/11/2021 Cholesterol 144, HDL 63, triglycerides 78  Labs from Dr. Ashby [name] 02/02/2022 Cholesterol 130, HDL 57, LDL 56, triglycerides 85  Risk Assessment/Calculations:       Physical Exam:    VS:  BP (!) 146/72 (BP Location: Left Arm, Patient Position: Sitting, Cuff Size: Large)   Pulse (!) 57   Ht '5\' 6"'$  (1.676 m)   Wt 243 lb 6.4 oz (110.4 kg)   SpO2 94%   BMI 39.29 kg/m     Wt Readings from Last 3 Encounters:  02/25/22 243 lb 6.4 oz (110.4 kg)  02/17/21 260  lb (117.9 kg)  10/27/20 264 lb (119.7 kg)     General: Alert, oriented x3, no distress, morbidly obese Head: no evidence of trauma, PERRL, EOMI, no exophtalmos or lid lag, no myxedema, no xanthelasma; normal ears, nose and oropharynx Neck: normal jugular venous pulsations and no hepatojugular reflux; brisk carotid pulses without delay and no carotid bruits Chest: clear to auscultation, no signs of consolidation by percussion or palpation, normal fremitus, symmetrical and full respiratory excursions Cardiovascular: normal position and quality of the apical impulse, regular rhythm, normal first and second heart sounds, no murmurs, rubs or gallops Abdomen: no tenderness or distention, no masses by palpation, no abnormal pulsatility or arterial bruits, normal bowel sounds, no hepatosplenomegaly Extremities: no clubbing, cyanosis or edema; 2+ radial, ulnar and brachial pulses bilaterally; 2+ right femoral, posterior tibial and dorsalis  pedis pulses; 2+ left femoral, posterior tibial and dorsalis pedis pulses; no subclavian or femoral bruits Neurological: grossly nonfocal Psych: Normal mood and affect   ASSESSMENT:    1. Chronic diastolic heart failure ([place])   2. Essential hypertension   3. History of gout   4. Stage 3b chronic kidney disease ([place])   5. Severe obesity (BMI 35.0-39.9) with comorbidity ([place])      PLAN:    In order of problems listed above:  CHF: She has no clinical signs of hypervolemia and has had recently worsening creatinine and uric acid.  Will cut the dose of furosemide back to 40 mg daily, may increase to 80 mg if her weight is over 250 pounds.  Hopefully this will lead to improvement in her renal parameters and uric acid levels.   HTN: Well-controlled CKD 3:  Recent worsening of creatinine may be due to excessive diuresis. History of gout: Uric acid is a little higher, hopefully will improve with less diuretic. Morbid obesity: It appears that she has had some true  weight loss and is no longer morbidly obese but rather severely obese range. Wheezing: None recently.  This was probably cardiac asthma.  Note that she had normal pulmonary function test in 2017.  FEV1 was 2 L (102% of predicted) and did not change with bronchodilators.        Medication Adjustments/Labs and Tests Ordered: Current medicines are reviewed at length with the patient today.  Concerns regarding medicines are outlined above.  Orders Placed This Encounter  Procedures   Basic metabolic panel   Brain natriuretic peptide   Uric acid    Meds ordered this encounter  Medications   furosemide (LASIX) 40 MG tablet    Sig: Take 1 tablet (40 mg total) by mouth daily. Take 80 mg for a weight over 250 lbs    Dispense:  90 tablet    Refill:  3     Patient Instructions  Medication Instructions:  DECREASE the Furosemide to 40 mg once daily. For a weight of 250 pounds and higher, take 80 mg (2 of the 40 mg tablets)  *If you need a refill on your cardiac medications before your next appointment, please call your pharmacy*   Lab Work: Your provider would like for you to return in one week to have the following labs drawn: BMET. BNP, and URIC ACID. You do not need an appointment for the lab. Once in our office lobby there is a podium where you can sign in and ring the doorbell to alert [place] that you are here. The lab is open from 8:00 am to 4 pm; closed for lunch from 12:45pm-1:45pm.  You may also go to any of these [facility] locations:   [place] - [place] [employer] [address]   If you have labs (blood work) drawn today and your tests are completely normal, you will receive your results only by: [employer] (if you have MyChart) OR A paper copy in the mail If you have any lab test that is abnormal or we need to change your treatment, we will call you to review the  results.   Testing/Procedures: None ordered   Follow-Up: At [facility], you and your health needs are our priority.  As part of our continuing mission to provide you with exceptional heart care, we have created designated Provider Care Teams.  These Care Teams include your  primary Cardiologist (physician) and Advanced Practice Providers (APPs -  Physician Assistants and Nurse Practitioners) who all work together to provide you with the care you need, when you need it.  We recommend signing up for the patient portal called "MyChart".  Sign up information is provided on this After Visit Summary.  MyChart is used to connect with patients for Virtual Visits (Telemedicine).  Patients are able to view lab/test results, encounter notes, upcoming appointments, etc.  Non-urgent messages can be sent to your provider as well.   To learn more about what you can do with MyChart, go to [email or web address].    Your next appointment:   12 month(s)  The format for your next appointment:   In Person  Provider:   Sanda Klein, MD {    Important Information About Sugar         Signed, Brittany Klein, MD  02/25/2022 11:49 AM    [place]

## 2022-02-25 NOTE — Addendum Note (Signed)
Addended by: Orma Render on: 02/25/2022 12:59 PM   Modules accepted: Orders

## 2022-02-25 NOTE — Patient Instructions (Signed)
Medication Instructions:  DECREASE the Furosemide to 40 mg once daily. For a weight of 250 pounds and higher, take 80 mg (2 of the 40 mg tablets)  *If you need a refill on your cardiac medications before your next appointment, please call your pharmacy*   Lab Work: Your provider would like for you to return in one week to have the following labs drawn: BMET. BNP, and URIC ACID. You do not need an appointment for the lab. Once in our office lobby there is a podium where you can sign in and ring the doorbell to alert [place] that you are here. The lab is open from 8:00 am to 4 pm; closed for lunch from 12:45pm-1:45pm.  You may also go to any of these [facility] locations:   [place] ([place]) - [place] [employer] [address]   If you have labs (blood work) drawn today and your tests are completely normal, you will receive your results only by: [employer] (if you have MyChart) OR A paper copy in the mail If you have any lab test that is abnormal or we need to change your treatment, we will call you to review the results.   Testing/Procedures: None ordered   Follow-Up: At [facility], you and your health needs are our priority.  As part of our continuing mission to provide you with exceptional heart care, we have created designated Provider Care Teams.  These Care Teams include your primary Cardiologist (physician) and Advanced Practice Providers (APPs -  Physician Assistants and Nurse Practitioners) who all work together to provide you with the care you need, when you need it.  We recommend signing up for the patient portal called "MyChart".  Sign up information is provided on this After Visit Summary.  MyChart is used to connect with patients for Virtual Visits (Telemedicine).  Patients are able to view lab/test results, encounter notes, upcoming appointments, etc.  Non-urgent messages can be sent to your  provider as well.   To learn more about what you can do with MyChart, go to [email or web address].    Your next appointment:   12 month(s)  The format for your next appointment:   In Person  Provider:   Sanda Klein, MD {    Important Information About Sugar

## 2022-02-28 DIAGNOSIS — M1A9XX1 Chronic gout, unspecified, with tophus (tophi): Secondary | ICD-10-CM | POA: Diagnosis not present

## 2022-02-28 DIAGNOSIS — M1990 Unspecified osteoarthritis, unspecified site: Secondary | ICD-10-CM | POA: Diagnosis not present

## 2022-02-28 DIAGNOSIS — L501 Idiopathic urticaria: Secondary | ICD-10-CM | POA: Diagnosis not present

## 2022-02-28 DIAGNOSIS — Z79899 Other long term (current) drug therapy: Secondary | ICD-10-CM | POA: Diagnosis not present

## 2022-02-28 DIAGNOSIS — N184 Chronic kidney disease, stage 4 (severe): Secondary | ICD-10-CM | POA: Diagnosis not present

## 2022-02-28 NOTE — Addendum Note (Signed)
Addended by: Orma Render on: 02/28/2022 10:00 AM   Modules accepted: Orders

## 2022-03-03 DIAGNOSIS — H5211 Myopia, right eye: Secondary | ICD-10-CM | POA: Diagnosis not present

## 2022-03-03 DIAGNOSIS — H40023 Open angle with borderline findings, high risk, bilateral: Secondary | ICD-10-CM | POA: Diagnosis not present

## 2022-03-03 DIAGNOSIS — H02403 Unspecified ptosis of bilateral eyelids: Secondary | ICD-10-CM | POA: Diagnosis not present

## 2022-03-03 DIAGNOSIS — H02831 Dermatochalasis of right upper eyelid: Secondary | ICD-10-CM | POA: Diagnosis not present

## 2022-03-04 DIAGNOSIS — I10 Essential (primary) hypertension: Secondary | ICD-10-CM | POA: Diagnosis not present

## 2022-03-04 DIAGNOSIS — Z8739 Personal history of other diseases of the musculoskeletal system and connective tissue: Secondary | ICD-10-CM | POA: Diagnosis not present

## 2022-03-04 DIAGNOSIS — I5032 Chronic diastolic (congestive) heart failure: Secondary | ICD-10-CM | POA: Diagnosis not present

## 2022-03-05 LAB — BASIC METABOLIC PANEL
BUN/Creatinine Ratio: 14 (ref 12–28)
BUN: 19 mg/dL (ref 8–27)
CO2: 26 mmol/L (ref 20–29)
Calcium: 9.7 mg/dL (ref 8.7–10.3)
Chloride: 106 mmol/L (ref 96–106)
Creatinine, Ser: 1.35 mg/dL — ABNORMAL HIGH (ref 0.57–1.00)
Glucose: 96 mg/dL (ref 70–99)
Potassium: 5.1 mmol/L (ref 3.5–5.2)
Sodium: 144 mmol/L (ref 134–144)
eGFR: 39 mL/min/{1.73_m2} — ABNORMAL LOW (ref >59)

## 2022-03-05 LAB — BRAIN NATRIURETIC PEPTIDE: BNP: 54.7 pg/mL (ref 0.0–100.0)

## 2022-03-05 LAB — URIC ACID: Uric Acid: 7.9 mg/dL (ref 3.1–7.9)

## 2022-03-30 DIAGNOSIS — L501 Idiopathic urticaria: Secondary | ICD-10-CM | POA: Diagnosis not present

## 2022-03-30 DIAGNOSIS — N1832 Chronic kidney disease, stage 3b: Secondary | ICD-10-CM | POA: Diagnosis not present

## 2022-03-30 DIAGNOSIS — I129 Hypertensive chronic kidney disease with stage 1 through stage 4 chronic kidney disease, or unspecified chronic kidney disease: Secondary | ICD-10-CM | POA: Diagnosis not present

## 2022-03-30 DIAGNOSIS — E782 Mixed hyperlipidemia: Secondary | ICD-10-CM | POA: Diagnosis not present

## 2022-04-06 DIAGNOSIS — M1A0790 Idiopathic chronic gout, unspecified ankle and foot, without tophus (tophi): Secondary | ICD-10-CM | POA: Diagnosis not present

## 2022-04-06 DIAGNOSIS — M1A9XX1 Chronic gout, unspecified, with tophus (tophi): Secondary | ICD-10-CM | POA: Diagnosis not present

## 2022-04-06 DIAGNOSIS — I129 Hypertensive chronic kidney disease with stage 1 through stage 4 chronic kidney disease, or unspecified chronic kidney disease: Secondary | ICD-10-CM | POA: Diagnosis not present

## 2022-04-06 DIAGNOSIS — N1832 Chronic kidney disease, stage 3b: Secondary | ICD-10-CM | POA: Diagnosis not present

## 2022-04-13 DIAGNOSIS — N184 Chronic kidney disease, stage 4 (severe): Secondary | ICD-10-CM | POA: Diagnosis not present

## 2022-04-13 DIAGNOSIS — M1A9XX1 Chronic gout, unspecified, with tophus (tophi): Secondary | ICD-10-CM | POA: Diagnosis not present

## 2022-04-13 DIAGNOSIS — M1990 Unspecified osteoarthritis, unspecified site: Secondary | ICD-10-CM | POA: Diagnosis not present

## 2022-04-13 DIAGNOSIS — Z79899 Other long term (current) drug therapy: Secondary | ICD-10-CM | POA: Diagnosis not present

## 2022-04-15 DIAGNOSIS — Z23 Encounter for immunization: Secondary | ICD-10-CM | POA: Diagnosis not present

## 2022-04-21 DIAGNOSIS — Z23 Encounter for immunization: Secondary | ICD-10-CM | POA: Diagnosis not present

## 2022-04-27 DIAGNOSIS — M1A0790 Idiopathic chronic gout, unspecified ankle and foot, without tophus (tophi): Secondary | ICD-10-CM | POA: Diagnosis not present

## 2022-05-02 DIAGNOSIS — L501 Idiopathic urticaria: Secondary | ICD-10-CM | POA: Diagnosis not present

## 2022-05-11 DIAGNOSIS — M1A0790 Idiopathic chronic gout, unspecified ankle and foot, without tophus (tophi): Secondary | ICD-10-CM | POA: Diagnosis not present

## 2022-05-19 DIAGNOSIS — Z79899 Other long term (current) drug therapy: Secondary | ICD-10-CM | POA: Diagnosis not present

## 2022-05-19 DIAGNOSIS — N1831 Chronic kidney disease, stage 3a: Secondary | ICD-10-CM | POA: Diagnosis not present

## 2022-05-19 DIAGNOSIS — M1990 Unspecified osteoarthritis, unspecified site: Secondary | ICD-10-CM | POA: Diagnosis not present

## 2022-05-19 DIAGNOSIS — M1A9XX1 Chronic gout, unspecified, with tophus (tophi): Secondary | ICD-10-CM | POA: Diagnosis not present

## 2022-05-25 DIAGNOSIS — M1A0790 Idiopathic chronic gout, unspecified ankle and foot, without tophus (tophi): Secondary | ICD-10-CM | POA: Diagnosis not present

## 2022-05-31 DIAGNOSIS — L501 Idiopathic urticaria: Secondary | ICD-10-CM | POA: Diagnosis not present

## 2022-06-13 ENCOUNTER — Other Ambulatory Visit: Payer: Self-pay | Admitting: Internal Medicine

## 2022-06-13 DIAGNOSIS — Z1231 Encounter for screening mammogram for malignant neoplasm of breast: Secondary | ICD-10-CM

## 2022-06-15 DIAGNOSIS — M1A0790 Idiopathic chronic gout, unspecified ankle and foot, without tophus (tophi): Secondary | ICD-10-CM | POA: Diagnosis not present

## 2022-06-29 DIAGNOSIS — M1A0790 Idiopathic chronic gout, unspecified ankle and foot, without tophus (tophi): Secondary | ICD-10-CM | POA: Diagnosis not present

## 2022-07-01 DIAGNOSIS — L501 Idiopathic urticaria: Secondary | ICD-10-CM | POA: Diagnosis not present

## 2022-07-13 DIAGNOSIS — Z79899 Other long term (current) drug therapy: Secondary | ICD-10-CM | POA: Diagnosis not present

## 2022-07-13 DIAGNOSIS — M1A9XX1 Chronic gout, unspecified, with tophus (tophi): Secondary | ICD-10-CM | POA: Diagnosis not present

## 2022-07-13 DIAGNOSIS — M1990 Unspecified osteoarthritis, unspecified site: Secondary | ICD-10-CM | POA: Diagnosis not present

## 2022-07-13 DIAGNOSIS — M1A0790 Idiopathic chronic gout, unspecified ankle and foot, without tophus (tophi): Secondary | ICD-10-CM | POA: Diagnosis not present

## 2022-07-13 DIAGNOSIS — N1831 Chronic kidney disease, stage 3a: Secondary | ICD-10-CM | POA: Diagnosis not present

## 2022-07-27 DIAGNOSIS — M1A0790 Idiopathic chronic gout, unspecified ankle and foot, without tophus (tophi): Secondary | ICD-10-CM | POA: Diagnosis not present

## 2022-07-29 DIAGNOSIS — L501 Idiopathic urticaria: Secondary | ICD-10-CM | POA: Diagnosis not present

## 2022-08-09 ENCOUNTER — Ambulatory Visit
Admission: RE | Admit: 2022-08-09 | Discharge: 2022-08-09 | Disposition: A | Discharge status: Home / Self Care | Payer: Medicare Other | Source: Ambulatory Visit | Attending: Internal Medicine | Admitting: Internal Medicine

## 2022-08-09 DIAGNOSIS — Z1231 Encounter for screening mammogram for malignant neoplasm of breast: Secondary | ICD-10-CM

## 2022-08-10 DIAGNOSIS — M1A0790 Idiopathic chronic gout, unspecified ankle and foot, without tophus (tophi): Secondary | ICD-10-CM | POA: Diagnosis not present

## 2022-08-17 DIAGNOSIS — L501 Idiopathic urticaria: Secondary | ICD-10-CM | POA: Diagnosis not present

## 2022-08-17 DIAGNOSIS — M1A0790 Idiopathic chronic gout, unspecified ankle and foot, without tophus (tophi): Secondary | ICD-10-CM | POA: Diagnosis not present

## 2022-08-17 DIAGNOSIS — I129 Hypertensive chronic kidney disease with stage 1 through stage 4 chronic kidney disease, or unspecified chronic kidney disease: Secondary | ICD-10-CM | POA: Diagnosis not present

## 2022-08-17 DIAGNOSIS — E890 Postprocedural hypothyroidism: Secondary | ICD-10-CM | POA: Diagnosis not present

## 2022-08-17 DIAGNOSIS — N1832 Chronic kidney disease, stage 3b: Secondary | ICD-10-CM | POA: Diagnosis not present

## 2022-08-17 DIAGNOSIS — I5032 Chronic diastolic (congestive) heart failure: Secondary | ICD-10-CM | POA: Diagnosis not present

## 2022-08-17 DIAGNOSIS — R600 Localized edema: Secondary | ICD-10-CM | POA: Diagnosis not present

## 2022-08-17 DIAGNOSIS — E782 Mixed hyperlipidemia: Secondary | ICD-10-CM | POA: Diagnosis not present

## 2022-08-17 DIAGNOSIS — I10 Essential (primary) hypertension: Secondary | ICD-10-CM | POA: Diagnosis not present

## 2022-08-24 DIAGNOSIS — N1832 Chronic kidney disease, stage 3b: Secondary | ICD-10-CM | POA: Diagnosis not present

## 2022-08-24 DIAGNOSIS — R600 Localized edema: Secondary | ICD-10-CM | POA: Diagnosis not present

## 2022-08-24 DIAGNOSIS — I5032 Chronic diastolic (congestive) heart failure: Secondary | ICD-10-CM | POA: Diagnosis not present

## 2022-08-24 DIAGNOSIS — E890 Postprocedural hypothyroidism: Secondary | ICD-10-CM | POA: Diagnosis not present

## 2022-08-24 DIAGNOSIS — I129 Hypertensive chronic kidney disease with stage 1 through stage 4 chronic kidney disease, or unspecified chronic kidney disease: Secondary | ICD-10-CM | POA: Diagnosis not present

## 2022-08-24 DIAGNOSIS — I10 Essential (primary) hypertension: Secondary | ICD-10-CM | POA: Diagnosis not present

## 2022-08-24 DIAGNOSIS — Z0000 Encounter for general adult medical examination without abnormal findings: Secondary | ICD-10-CM | POA: Diagnosis not present

## 2022-08-24 DIAGNOSIS — M1A0790 Idiopathic chronic gout, unspecified ankle and foot, without tophus (tophi): Secondary | ICD-10-CM | POA: Diagnosis not present

## 2022-08-24 DIAGNOSIS — M150 Primary generalized (osteo)arthritis: Secondary | ICD-10-CM | POA: Diagnosis not present

## 2022-08-24 DIAGNOSIS — E782 Mixed hyperlipidemia: Secondary | ICD-10-CM | POA: Diagnosis not present

## 2022-08-24 DIAGNOSIS — L501 Idiopathic urticaria: Secondary | ICD-10-CM | POA: Diagnosis not present

## 2022-08-31 DIAGNOSIS — L501 Idiopathic urticaria: Secondary | ICD-10-CM | POA: Diagnosis not present

## 2022-09-07 DIAGNOSIS — M1A0790 Idiopathic chronic gout, unspecified ankle and foot, without tophus (tophi): Secondary | ICD-10-CM | POA: Diagnosis not present

## 2022-09-21 DIAGNOSIS — M1A0790 Idiopathic chronic gout, unspecified ankle and foot, without tophus (tophi): Secondary | ICD-10-CM | POA: Diagnosis not present

## 2022-09-29 DIAGNOSIS — L501 Idiopathic urticaria: Secondary | ICD-10-CM | POA: Diagnosis not present

## 2022-10-05 DIAGNOSIS — M1A0790 Idiopathic chronic gout, unspecified ankle and foot, without tophus (tophi): Secondary | ICD-10-CM | POA: Diagnosis not present

## 2022-10-19 DIAGNOSIS — M1A0790 Idiopathic chronic gout, unspecified ankle and foot, without tophus (tophi): Secondary | ICD-10-CM | POA: Diagnosis not present

## 2022-10-31 DIAGNOSIS — L501 Idiopathic urticaria: Secondary | ICD-10-CM | POA: Diagnosis not present

## 2022-11-01 DIAGNOSIS — H10413 Chronic giant papillary conjunctivitis, bilateral: Secondary | ICD-10-CM | POA: Diagnosis not present

## 2022-11-01 DIAGNOSIS — H04123 Dry eye syndrome of bilateral lacrimal glands: Secondary | ICD-10-CM | POA: Diagnosis not present

## 2022-11-01 DIAGNOSIS — H26493 Other secondary cataract, bilateral: Secondary | ICD-10-CM | POA: Diagnosis not present

## 2022-11-09 DIAGNOSIS — N1831 Chronic kidney disease, stage 3a: Secondary | ICD-10-CM | POA: Diagnosis not present

## 2022-11-09 DIAGNOSIS — M1A9XX1 Chronic gout, unspecified, with tophus (tophi): Secondary | ICD-10-CM | POA: Diagnosis not present

## 2022-11-09 DIAGNOSIS — M25562 Pain in left knee: Secondary | ICD-10-CM | POA: Diagnosis not present

## 2022-11-09 DIAGNOSIS — Z79899 Other long term (current) drug therapy: Secondary | ICD-10-CM | POA: Diagnosis not present

## 2022-11-09 DIAGNOSIS — M25561 Pain in right knee: Secondary | ICD-10-CM | POA: Diagnosis not present

## 2022-11-09 DIAGNOSIS — M1A0790 Idiopathic chronic gout, unspecified ankle and foot, without tophus (tophi): Secondary | ICD-10-CM | POA: Diagnosis not present

## 2022-11-09 DIAGNOSIS — M1990 Unspecified osteoarthritis, unspecified site: Secondary | ICD-10-CM | POA: Diagnosis not present

## 2022-11-16 DIAGNOSIS — Z1211 Encounter for screening for malignant neoplasm of colon: Secondary | ICD-10-CM | POA: Diagnosis not present

## 2022-11-16 DIAGNOSIS — K5790 Diverticulosis of intestine, part unspecified, without perforation or abscess without bleeding: Secondary | ICD-10-CM | POA: Diagnosis not present

## 2022-11-16 DIAGNOSIS — Z86010 Personal history of colonic polyps: Secondary | ICD-10-CM | POA: Diagnosis not present

## 2022-11-16 DIAGNOSIS — K648 Other hemorrhoids: Secondary | ICD-10-CM | POA: Diagnosis not present

## 2022-11-23 DIAGNOSIS — M1A0790 Idiopathic chronic gout, unspecified ankle and foot, without tophus (tophi): Secondary | ICD-10-CM | POA: Diagnosis not present

## 2022-11-29 DIAGNOSIS — L501 Idiopathic urticaria: Secondary | ICD-10-CM | POA: Diagnosis not present

## 2022-12-07 DIAGNOSIS — M1A0790 Idiopathic chronic gout, unspecified ankle and foot, without tophus (tophi): Secondary | ICD-10-CM | POA: Diagnosis not present

## 2022-12-08 DIAGNOSIS — M1711 Unilateral primary osteoarthritis, right knee: Secondary | ICD-10-CM | POA: Diagnosis not present

## 2022-12-21 DIAGNOSIS — M1A0790 Idiopathic chronic gout, unspecified ankle and foot, without tophus (tophi): Secondary | ICD-10-CM | POA: Diagnosis not present

## 2022-12-30 DIAGNOSIS — L501 Idiopathic urticaria: Secondary | ICD-10-CM | POA: Diagnosis not present

## 2023-01-04 DIAGNOSIS — M1A0790 Idiopathic chronic gout, unspecified ankle and foot, without tophus (tophi): Secondary | ICD-10-CM | POA: Diagnosis not present

## 2023-01-18 DIAGNOSIS — M1A9XX1 Chronic gout, unspecified, with tophus (tophi): Secondary | ICD-10-CM | POA: Diagnosis not present

## 2023-01-18 DIAGNOSIS — M25561 Pain in right knee: Secondary | ICD-10-CM | POA: Diagnosis not present

## 2023-01-18 DIAGNOSIS — N1831 Chronic kidney disease, stage 3a: Secondary | ICD-10-CM | POA: Diagnosis not present

## 2023-01-18 DIAGNOSIS — M7989 Other specified soft tissue disorders: Secondary | ICD-10-CM | POA: Diagnosis not present

## 2023-01-18 DIAGNOSIS — M1990 Unspecified osteoarthritis, unspecified site: Secondary | ICD-10-CM | POA: Diagnosis not present

## 2023-01-18 DIAGNOSIS — M1A0790 Idiopathic chronic gout, unspecified ankle and foot, without tophus (tophi): Secondary | ICD-10-CM | POA: Diagnosis not present

## 2023-01-18 DIAGNOSIS — Z79899 Other long term (current) drug therapy: Secondary | ICD-10-CM | POA: Diagnosis not present

## 2023-01-30 DIAGNOSIS — L501 Idiopathic urticaria: Secondary | ICD-10-CM | POA: Diagnosis not present

## 2023-02-01 DIAGNOSIS — M1A0790 Idiopathic chronic gout, unspecified ankle and foot, without tophus (tophi): Secondary | ICD-10-CM | POA: Diagnosis not present

## 2023-02-15 DIAGNOSIS — M1A0790 Idiopathic chronic gout, unspecified ankle and foot, without tophus (tophi): Secondary | ICD-10-CM | POA: Diagnosis not present

## 2023-02-27 ENCOUNTER — Ambulatory Visit: Payer: Medicare Other | Admitting: Cardiovascular Disease

## 2023-02-27 DIAGNOSIS — L501 Idiopathic urticaria: Secondary | ICD-10-CM | POA: Diagnosis not present

## 2023-03-01 DIAGNOSIS — M1A0790 Idiopathic chronic gout, unspecified ankle and foot, without tophus (tophi): Secondary | ICD-10-CM | POA: Diagnosis not present

## 2023-03-06 DIAGNOSIS — Q432 Other congenital functional disorders of colon: Secondary | ICD-10-CM | POA: Diagnosis not present

## 2023-03-06 DIAGNOSIS — Z86010 Personal history of colonic polyps: Secondary | ICD-10-CM | POA: Diagnosis not present

## 2023-03-06 DIAGNOSIS — K5790 Diverticulosis of intestine, part unspecified, without perforation or abscess without bleeding: Secondary | ICD-10-CM | POA: Diagnosis not present

## 2023-03-06 DIAGNOSIS — Z1211 Encounter for screening for malignant neoplasm of colon: Secondary | ICD-10-CM | POA: Diagnosis not present

## 2023-03-06 DIAGNOSIS — K648 Other hemorrhoids: Secondary | ICD-10-CM | POA: Diagnosis not present

## 2023-03-06 DIAGNOSIS — K5730 Diverticulosis of large intestine without perforation or abscess without bleeding: Secondary | ICD-10-CM | POA: Diagnosis not present

## 2023-03-06 DIAGNOSIS — K635 Polyp of colon: Secondary | ICD-10-CM | POA: Diagnosis not present

## 2023-03-06 DIAGNOSIS — D124 Benign neoplasm of descending colon: Secondary | ICD-10-CM | POA: Diagnosis not present

## 2023-03-06 DIAGNOSIS — K644 Residual hemorrhoidal skin tags: Secondary | ICD-10-CM | POA: Diagnosis not present

## 2023-03-09 DIAGNOSIS — N1832 Chronic kidney disease, stage 3b: Secondary | ICD-10-CM | POA: Diagnosis not present

## 2023-03-09 DIAGNOSIS — E890 Postprocedural hypothyroidism: Secondary | ICD-10-CM | POA: Diagnosis not present

## 2023-03-09 DIAGNOSIS — R600 Localized edema: Secondary | ICD-10-CM | POA: Diagnosis not present

## 2023-03-09 DIAGNOSIS — L501 Idiopathic urticaria: Secondary | ICD-10-CM | POA: Diagnosis not present

## 2023-03-09 DIAGNOSIS — I10 Essential (primary) hypertension: Secondary | ICD-10-CM | POA: Diagnosis not present

## 2023-03-09 DIAGNOSIS — M150 Primary generalized (osteo)arthritis: Secondary | ICD-10-CM | POA: Diagnosis not present

## 2023-03-09 DIAGNOSIS — I5032 Chronic diastolic (congestive) heart failure: Secondary | ICD-10-CM | POA: Diagnosis not present

## 2023-03-09 DIAGNOSIS — M1A0790 Idiopathic chronic gout, unspecified ankle and foot, without tophus (tophi): Secondary | ICD-10-CM | POA: Diagnosis not present

## 2023-03-09 DIAGNOSIS — E782 Mixed hyperlipidemia: Secondary | ICD-10-CM | POA: Diagnosis not present

## 2023-03-09 DIAGNOSIS — I129 Hypertensive chronic kidney disease with stage 1 through stage 4 chronic kidney disease, or unspecified chronic kidney disease: Secondary | ICD-10-CM | POA: Diagnosis not present

## 2023-03-13 DIAGNOSIS — Z6841 Body Mass Index (BMI) 40.0 and over, adult: Secondary | ICD-10-CM | POA: Diagnosis not present

## 2023-03-13 DIAGNOSIS — Z01419 Encounter for gynecological examination (general) (routine) without abnormal findings: Secondary | ICD-10-CM | POA: Diagnosis not present

## 2023-03-15 DIAGNOSIS — M1A0790 Idiopathic chronic gout, unspecified ankle and foot, without tophus (tophi): Secondary | ICD-10-CM | POA: Diagnosis not present

## 2023-03-16 DIAGNOSIS — I10 Essential (primary) hypertension: Secondary | ICD-10-CM | POA: Diagnosis not present

## 2023-03-16 DIAGNOSIS — E782 Mixed hyperlipidemia: Secondary | ICD-10-CM | POA: Diagnosis not present

## 2023-03-16 DIAGNOSIS — E890 Postprocedural hypothyroidism: Secondary | ICD-10-CM | POA: Diagnosis not present

## 2023-03-16 DIAGNOSIS — I5032 Chronic diastolic (congestive) heart failure: Secondary | ICD-10-CM | POA: Diagnosis not present

## 2023-03-16 DIAGNOSIS — Z23 Encounter for immunization: Secondary | ICD-10-CM | POA: Diagnosis not present

## 2023-03-16 DIAGNOSIS — I129 Hypertensive chronic kidney disease with stage 1 through stage 4 chronic kidney disease, or unspecified chronic kidney disease: Secondary | ICD-10-CM | POA: Diagnosis not present

## 2023-03-16 DIAGNOSIS — I130 Hypertensive heart and chronic kidney disease with heart failure and stage 1 through stage 4 chronic kidney disease, or unspecified chronic kidney disease: Secondary | ICD-10-CM | POA: Diagnosis not present

## 2023-03-16 DIAGNOSIS — R600 Localized edema: Secondary | ICD-10-CM | POA: Diagnosis not present

## 2023-03-16 DIAGNOSIS — N1832 Chronic kidney disease, stage 3b: Secondary | ICD-10-CM | POA: Diagnosis not present

## 2023-03-16 DIAGNOSIS — M1A0790 Idiopathic chronic gout, unspecified ankle and foot, without tophus (tophi): Secondary | ICD-10-CM | POA: Diagnosis not present

## 2023-03-16 DIAGNOSIS — L501 Idiopathic urticaria: Secondary | ICD-10-CM | POA: Diagnosis not present

## 2023-03-26 NOTE — Progress Notes (Unsigned)
  Cardiology Office Note:  .   Date:  03/26/2023  ID:  Brittany [name], DOB 08-Dec-1940, MRN [id number] PCP: Georgianne Fick, MD  [facility] Providers Cardiologist:  Thurmon Fair, MD    History of Present Illness: .   Brittany [name] is a 82 y.o. female with past medical history of chronic diastolic heart failure, hypertension, CKD stage IIIa, gout, obesity.  Patient is followed by Dr. Royann Shivers with presents today for an annual follow-up appointment.  Per chart review, patient previously underwent echocardiogram on 10/08/2020 that showed calculated LVEF 64.34%, normal LV size with borderline wall thickness, grade 1 diastolic dysfunction, no significant valvular abnormalities.  She established care with Dr. Royann Shivers in 10/2020 for evaluation of shortness of breath and edema.  Her Lasix was increased and she felt better.  Overall, her presentation and workup was consistent with diastolic heart failure, likely due to history of hypertension.  Patient was last seen by Dr. Royann Shivers on 02/25/2022.  At that time, patient did not have worsening shortness of breath.  Continued to have NYHA functional class II exertional dyspnea.  On recent labs, she did have worsening creatinine and uric acid.  Her Lasix was reduced from 80 mg daily to 40 mg daily.  Her BP was well-controlled.  Chronic diastolic heart failure -Echocardiogram from 09/2020 showed EF 64%, grade 1 diastolic dysfunction -Currently on Lasix 40 mg daily -   Hypertension -Currently on Lasix 40 mg daily, irbesartan 150 mg daily, guanfacine 2 mg daily -   CKD stage III -   History of gout  Obesity  ROS: ***  Studies Reviewed: .        *** Risk Assessment/Calculations:   {Does this patient have ATRIAL FIBRILLATION?[phone]} No BP recorded.  {Refresh Note OR Click here to enter BP  :1}***       Physical Exam:   VS:  There were no vitals taken for this visit.   Wt Readings from Last 3 Encounters:  02/25/22  243 lb 6.4 oz (110.4 kg)  02/17/21 260 lb (117.9 kg)  10/27/20 264 lb (119.7 kg)    GEN: Well nourished, well developed in no acute distress NECK: No JVD; No carotid bruits CARDIAC: ***RRR, no murmurs, rubs, gallops RESPIRATORY:  Clear to auscultation without rales, wheezing or rhonchi  ABDOMEN: Soft, non-tender, non-distended EXTREMITIES:  No edema; No deformity   ASSESSMENT AND PLAN: .   ***    {Are you ordering a CV Procedure (e.g. stress test, cath, DCCV, TEE, etc)?   Press F2        :[id number]}  Dispo: ***  Signed, Jonita Albee, PA-C

## 2023-03-29 DIAGNOSIS — M1A0790 Idiopathic chronic gout, unspecified ankle and foot, without tophus (tophi): Secondary | ICD-10-CM | POA: Diagnosis not present

## 2023-03-30 ENCOUNTER — Encounter: Payer: Self-pay | Admitting: Cardiology

## 2023-03-30 ENCOUNTER — Ambulatory Visit: Payer: Medicare Other | Attending: Cardiovascular Disease | Admitting: Cardiology

## 2023-03-30 ENCOUNTER — Other Ambulatory Visit: Payer: Self-pay

## 2023-03-30 VITALS — BP 138/88 | HR 63 | Ht 66.0 in | Wt 251.0 lb

## 2023-03-30 DIAGNOSIS — I10 Essential (primary) hypertension: Secondary | ICD-10-CM | POA: Insufficient documentation

## 2023-03-30 DIAGNOSIS — N1832 Chronic kidney disease, stage 3b: Secondary | ICD-10-CM | POA: Insufficient documentation

## 2023-03-30 DIAGNOSIS — I5032 Chronic diastolic (congestive) heart failure: Secondary | ICD-10-CM | POA: Diagnosis present

## 2023-03-30 DIAGNOSIS — R072 Precordial pain: Secondary | ICD-10-CM

## 2023-03-30 DIAGNOSIS — Z8739 Personal history of other diseases of the musculoskeletal system and connective tissue: Secondary | ICD-10-CM | POA: Diagnosis present

## 2023-03-30 MED ORDER — FUROSEMIDE 40 MG PO TABS: 40.0000 mg | ORAL_TABLET | Freq: Every day | ORAL | 3 refills | Status: DC

## 2023-03-30 NOTE — Patient Instructions (Addendum)
  Medication Instructions:  Increase Lasix (Furosemide)- take 40 mg Monday, Wednesday, Friday. Take 80 mg every other day.   *If you need a refill on your cardiac medications before your next appointment, please call your pharmacy*   Lab Work: BMET in 2 weeks    Testing/Procedures: Your physician has requested that you have en exercise stress myoview. For further information please visit [email or web address] Please follow instruction sheet, as given.   Follow-Up: At [facility], you and your health needs are our priority.  As part of our continuing mission to provide you with exceptional heart care, we have created designated Provider Care Teams.  These Care Teams include your primary Cardiologist (physician) and Advanced Practice Providers (APPs -  Physician Assistants and Nurse Practitioners) who all work together to provide you with the care you need, when you need it.  We recommend signing up for the patient portal called "MyChart".  Sign up information is provided on this After Visit Summary.  MyChart is used to connect with patients for Virtual Visits (Telemedicine).  Patients are able to view lab/test results, encounter notes, upcoming appointments, etc.  Non-urgent messages can be sent to your provider as well.   To learn more about what you can do with MyChart, go to [email or web address].    Your next appointment:   1 year(s)  Provider:   Thurmon Fair, MD     Other Instructions  How to Prepare for Your Myoview Test (stress test):  1.Please do not take these medications before your test:  (please note if this is an exercise test pt should hold beta blocker prior) 2.Your remaining medications may be taken with water. 3.Nothing to eat or drink, except water, 4 hours prior to arrival time.  NO caffeine/decaffeinated products, or chocolate 12 hours prior to arrival. 4.Ladies, please do not wear dresses.  Skirts or pants are approprate, please wear a short sleeve  shirt. 5.NO perfume, cologne or lotion 6.Wear comfortable walking shoes.  NO HEELS! 7.Total time is 3 to 4 hours; you may want to bring reading material for the waiting time. 8.Please report to [facility] for your test  What to expect after you arrive:  Once you arrive and check in for your appointment an IV will be started in your arm.  Then the Technoligist will inject a small amount of radioactive tracer.  There will be a 1 hour waiting period after this injection.  A series of pictures will be taken of your heart following this waiting period.  You will be prepped for the stress portion of the test.  During the stress portion of your test you will either walk on a treadmill or receive a small, safe amount of radioactive tracer injected in your IV.  After the stress portion, there is a short rest period during which time your heart and blood pressure will be monitored.  After the short rest period the Technologist will begin your second set of pictures.  Your doctor will inform you of your test results within 7-10 business days.  In preparation for your appointment, medication and supplies will be purchased.  Appointment availability is limited, so if you need to cancel or reschedule please call the office at [phone] 24 hours in advance to avoid a cancellation fee of $100.00  IF YOU THINK YOU MAY BE PREGNANT, PLEASE INFORM THE TECHNOLOGIST.

## 2023-03-31 ENCOUNTER — Telehealth (HOSPITAL_COMMUNITY): Payer: Self-pay | Admitting: Radiology

## 2023-03-31 NOTE — Telephone Encounter (Signed)
Patient given detailed instructions per Myocardial Perfusion Study Information Sheet for the test on 9/192024 at 12:45. Patient notified to arrive 15 minutes early and that it is imperative to arrive on time for appointment to keep from having the test rescheduled.  If you need to cancel or reschedule your appointment, please call the office within 24 hours of your appointment. . Patient verbalized understanding.EHK

## 2023-04-03 DIAGNOSIS — L501 Idiopathic urticaria: Secondary | ICD-10-CM | POA: Diagnosis not present

## 2023-04-06 ENCOUNTER — Ambulatory Visit (HOSPITAL_COMMUNITY): Payer: Medicare Other | Attending: Internal Medicine

## 2023-04-06 VITALS — Wt 251.0 lb

## 2023-04-06 DIAGNOSIS — I5032 Chronic diastolic (congestive) heart failure: Secondary | ICD-10-CM | POA: Diagnosis not present

## 2023-04-06 DIAGNOSIS — R072 Precordial pain: Secondary | ICD-10-CM | POA: Diagnosis not present

## 2023-04-06 DIAGNOSIS — I10 Essential (primary) hypertension: Secondary | ICD-10-CM | POA: Insufficient documentation

## 2023-04-06 MED ORDER — TECHNETIUM TC 99M TETROFOSMIN IV KIT
30.6000 | PACK | Freq: Once | INTRAVENOUS | Status: AC | PRN
  Administered 2023-04-06: 30.6 via INTRAVENOUS

## 2023-04-06 MED ORDER — TECHNETIUM TC 99M TETROFOSMIN IV KIT
10.7000 | PACK | Freq: Once | INTRAVENOUS | Status: AC | PRN
  Administered 2023-04-06: 10.7 via INTRAVENOUS

## 2023-04-06 MED ORDER — REGADENOSON 0.4 MG/5ML IV SOLN
0.4000 mg | Freq: Once | INTRAVENOUS | Status: AC
  Administered 2023-04-06: 0.4 mg via INTRAVENOUS

## 2023-04-07 LAB — MYOCARDIAL PERFUSION IMAGING
LV dias vol: 69 mL (ref 46–106)
LV sys vol: 34 mL
Nuc Stress EF: 51 %
Peak HR: 84 {beats}/min
Rest HR: 69 {beats}/min
Rest Nuclear Isotope Dose: 10.7 mCi
SDS: 3
SRS: 1
SSS: 4
ST Depression (mm): 0 mm
Stress Nuclear Isotope Dose: 30.6 mCi
TID: 1.11

## 2023-04-12 DIAGNOSIS — M1A0790 Idiopathic chronic gout, unspecified ankle and foot, without tophus (tophi): Secondary | ICD-10-CM | POA: Diagnosis not present

## 2023-04-17 DIAGNOSIS — R072 Precordial pain: Secondary | ICD-10-CM | POA: Diagnosis not present

## 2023-04-18 LAB — BASIC METABOLIC PANEL
BUN/Creatinine Ratio: 16 (ref 12–28)
BUN: 18 mg/dL (ref 8–27)
CO2: 26 mmol/L (ref 20–29)
Calcium: 9.2 mg/dL (ref 8.7–10.3)
Chloride: 103 mmol/L (ref 96–106)
Creatinine, Ser: 1.11 mg/dL — ABNORMAL HIGH (ref 0.57–1.00)
Glucose: 66 mg/dL — ABNORMAL LOW (ref 70–99)
Potassium: 4.2 mmol/L (ref 3.5–5.2)
Sodium: 143 mmol/L (ref 134–144)
eGFR: 50 mL/min/{1.73_m2} — ABNORMAL LOW (ref >59)

## 2023-04-19 ENCOUNTER — Telehealth: Payer: Self-pay

## 2023-04-19 NOTE — Telephone Encounter (Signed)
-----   Message from Jonita Albee sent at 04/18/2023  4:46 PM EDT ----- Please tell patient that her kidney function is stable since increasing her dose of lasix. Potassium is within normal limits. She should continue her current medications and follow up as arranged   Thanks KJ

## 2023-04-19 NOTE — Telephone Encounter (Signed)
Spoke with patient and she is aware of lab results and provider recommendations. She verbalized understanding. She will need new Rx for lasix since she has increased her dose. Will send updated Rx of [facility] to [facility] per patient request.

## 2023-04-26 DIAGNOSIS — M1A9XX1 Chronic gout, unspecified, with tophus (tophi): Secondary | ICD-10-CM | POA: Diagnosis not present

## 2023-04-26 DIAGNOSIS — M1990 Unspecified osteoarthritis, unspecified site: Secondary | ICD-10-CM | POA: Diagnosis not present

## 2023-04-26 DIAGNOSIS — M1A0790 Idiopathic chronic gout, unspecified ankle and foot, without tophus (tophi): Secondary | ICD-10-CM | POA: Diagnosis not present

## 2023-04-26 DIAGNOSIS — N1831 Chronic kidney disease, stage 3a: Secondary | ICD-10-CM | POA: Diagnosis not present

## 2023-04-26 DIAGNOSIS — Z79899 Other long term (current) drug therapy: Secondary | ICD-10-CM | POA: Diagnosis not present

## 2023-05-03 ENCOUNTER — Telehealth: Payer: Self-pay | Admitting: Cardiology

## 2023-05-03 DIAGNOSIS — J4550 Severe persistent asthma, uncomplicated: Secondary | ICD-10-CM | POA: Diagnosis not present

## 2023-05-03 NOTE — Telephone Encounter (Signed)
Reviewed patient's BP log from September- BP as low as 132/78 but as high as 187/91. SBP predominantly in the 140s-150s. Diastolic BP in the 80s-90s. Above goal. Please start amlodipine 5 mg daily. Arrange appointment in 4-6 weeks with me to follow up   Thanks KJ

## 2023-05-04 NOTE — Telephone Encounter (Signed)
Left message to call back  

## 2023-05-05 NOTE — Telephone Encounter (Signed)
Called Patient about BP Log no response

## 2023-05-08 NOTE — Telephone Encounter (Signed)
Left Message for patient to call back about blood pressure log

## 2023-05-09 MED ORDER — AMLODIPINE BESYLATE 5 MG PO TABS
5.0000 mg | ORAL_TABLET | Freq: Every day | ORAL | 3 refills | Status: DC
Start: 2023-05-09 — End: 2023-05-24

## 2023-05-09 NOTE — Telephone Encounter (Signed)
Called patients husbands' phone number and patient answered gave them the results of below and the verbalized understanding sent medication to Pharmacy.

## 2023-05-09 NOTE — Addendum Note (Signed)
Addended by: Clotilde Dieter on: 05/09/2023 01:56 PM   Modules accepted: Orders

## 2023-05-10 DIAGNOSIS — M1A9XX1 Chronic gout, unspecified, with tophus (tophi): Secondary | ICD-10-CM | POA: Diagnosis not present

## 2023-05-19 ENCOUNTER — Telehealth: Payer: Self-pay | Admitting: Cardiology

## 2023-05-19 NOTE — Telephone Encounter (Signed)
Spoke to patient  she states she started Amlodipine 5 mg  last Friday .  She has not been taking blood pressure regularly, she has noticed that her legs have swelling a little more seen starting medication. Short of breath  but not unusal   Patient states blood pressure is higher now than when  it was  when she was in the office visit with Adin Hector. NP  After talking to the patient , she states the blurry visit on is not new , but the lower leg swelling is.  She has been taking  furosemide 40 mg ( 1 tablets of 40 mg ) Monday - Wednesday- Friday . And other days  80 mg ( 2 tablets  of 40 mg) . Patient states she does not see any difference in weight loss. She states today' weight 252 lb. She states she is usually @ 248 lbs    Patient states  blood pressure  last night 177/100 at bedtime  This morning @ 9:15   187/96  after waking , no meds One hour later @ 10:45 --b/p 187/96 patient stated dhe took all medication except Amlodipine 5 mg About 15 - 20 min later b/p 187/88 She also states she was doing laundry  at the time she took last blood pressure   RN informed patient  to take Amlodipine now  and rest , do not take blood pressure again until @ 1 pm.   RN  will defer to  providers for action

## 2023-05-19 NOTE — Telephone Encounter (Signed)
Pt c/o medication issue:  1. Name of Medication: amLODipine (NORVASC) 5 MG tablet   2. How are you currently taking this medication (dosage and times per day)? Take 1 tablet (5 mg total) by mouth daily.  3. Are you having a reaction (difficulty breathing--STAT)? No   4. What is your medication issue? BP medication is not cooperating with patient. Want to know if there was another medication she could be on. BP this morning was 187/96; 201/83; 187/88. 177/100 was BP going to bed last night.   Pt c/o Shortness Of Breath: STAT if SOB developed within the last 24 hours or pt is noticeably SOB on the phone  1. Are you currently SOB (can you hear that pt is SOB on the phone)? Yes   2. How long have you been experiencing SOB? This morning  3. Are you SOB when sitting or when up moving around? Moving around  4. Are you currently experiencing any other symptoms? Feet swelling; blurred vision

## 2023-05-19 NOTE — Telephone Encounter (Signed)
Thanks, KJ. Looks like we could increase the irbesartan if the amlodipine is causing edema.  If I remember right, we are trying to avoid thiazide diuretics due to her tendency to develop gout.

## 2023-05-19 NOTE — Telephone Encounter (Signed)
Spoke to patient and message relayed. She verbalized understanding and agree. Appt scheduled for 11/6 with Annamarie Dawley

## 2023-05-20 NOTE — Progress Notes (Unsigned)
Cardiology Office Note:  .   Date:  05/24/2023  ID:  Brittany [name], DOB 04-09-1941, MRN [id number] PCP: Georgianne Fick, MD  [facility] Providers Cardiologist:  Thurmon Fair, MD  History of Present Illness: .   Brittany [name] is a 82 y.o. female with past medical history of chronic diastolic heart failure, hypertension, CKD stage IIIa, gout, obesity, HLD.  Patient is followed by Dr. Royann Shivers with presents today for an annual follow-up appointment.   Per chart review, patient previously underwent echocardiogram on 10/08/2020 that showed calculated LVEF 64%, normal LV size with borderline wall thickness, grade 1 diastolic dysfunction, no significant valvular abnormalities.  She established care with Dr. Royann Shivers in 10/2020 for evaluation of shortness of breath and edema.  Her Lasix was increased and she felt better.  Overall, her presentation and workup was consistent with diastolic heart failure, likely due to history of hypertension.  I saw patient in clinic on 03/30/23. 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  Patient also reported having intermittent left arm pain and worsening ankle edema.  Was on infusions for her gout.  Lasix increased to 80 mg and 40 mg alternating days. Patient kept a BP log at home that showed BP ranging from the 130s/70s-180s/90s.  Systolic blood pressure was predominantly in the 140s/150s.  Was started on amlodipine.  Called the office on 11/1 complaining of worsening lower extremity edema.  Was scheduled for an appointment.  Today, patient reports that since starting amlodipine, she has noticed worsening lower extremity swelling. The swelling has been gradually increasing and is not responsive to Lasix. Prior to starting amlodipine, the patient had only occasional swelling.  She also notes a slight increase in shortness of breath during her daily morning  walks. Notices that her shortness of breath is more significant when her leg swelling is worse. She is still able to walk for about 1 hour every day. Patient checks her BP regularly at home, BP log shows SBP predominantly in the 130s-150s. Denies dizziness, syncope, near syncope   The patient also reports a history of gout, but no recent flares. They have been trying to manage their salt intake to control their blood pressure and swelling. She is on lasix 40 mg and 80 mg on alternating days. She does not think that she is urinating enough on lasix.   They expressed a willingness to increase their dose of irbesartan to manage their blood pressure, in lieu of the amlodipine, which they believe is contributing to their edema.  ROS: Per HPI   Studies Reviewed: .   Cardiac Studies & Procedures     STRESS TESTS  MYOCARDIAL PERFUSION IMAGING 04/06/2023  Narrative   LV perfusion is normal. There is no evidence of ischemia. There is no evidence of infarction.   Left ventricular function is normal. Nuclear stress EF: 51%. The left ventricular ejection fraction is mildly decreased (45-54%). End diastolic cavity size is normal.   The study is normal. The study is low risk.              Risk Assessment/Calculations:             Physical Exam:   VS:  BP 120/84   Pulse 68   Ht 5\' 6"  (1.676 m)   Wt 249 lb (112.9 kg)   SpO2 99%   BMI 40.19 kg/m    Wt Readings from  Last 3 Encounters:  05/24/23 249 lb (112.9 kg)  04/06/23 251 lb (113.9 kg)  03/30/23 251 lb (113.9 kg)    GEN: Well nourished, well developed in no acute distress. Sitting comfortably on the exam table  NECK: No JVD CARDIAC: RRR, no murmurs, rubs, gallops. Radial pulses 2+ bilaterally  RESPIRATORY:  Clear to auscultation without rales, wheezing or rhonchi. Normal work of breathing on room air  ABDOMEN: Soft, non-tender, non-distended EXTREMITIES: 2+ edema in BLE; No deformity   ASSESSMENT AND PLAN: .    Chronic diastolic heart  failure Lower extremity edema  -Echocardiogram from 09/2020 showed EF 64%, grade 1 diastolic dysfunction -Patient was recently started on amlodipine for elevated BP.  Noted worsening lower extremity swelling after starting amlodipine.  Lasix was increased to 80 mg daily for 3 days without improvement in swelling.  - On exam today, patient has 2+ edema in BLE. lungs are clear. She does report occasional shortness of breath on exertion at times, but is still able to walk 1 hour every morning. She is on lasix, but does not think that she urinates enough.  - Stop amlodipine - Ordered BMP, BNP  - Continue lasix 40 mg MWF, 80 mg every other day for now- pending BNP and renal function, may adjust lasix dosing. Aside from ankle edema, she is fairly euvolemic on exam, but feels she is not diuresing well on lasix. I do not want to over diurese her with her history of CKD and gout  - Discussed importance of low-sodium diet. Patient admits to eating a lot of salt this weekend which is likely contributing to symptoms - Ordered repeat echocardiogram with worsening symptoms of hypervolemia    Hypertension -Patient was seen in 03/2023.  At elevated BP in clinic.  Was instructed to keep a BP log that showed elevated blood pressure at home.  Was started on amlodipine 5 mg daily in addition to her irbesartan 150 mg daily, guanfacine 2 mg daily, lasix. Patient reports worsening lower extremity edema in amlodipine  - BP well controlled in clinic today . SBP predominantly in the 140s-150s on home BP log  - Stop amlodipine  - Increase irbesartan to 225 mg daily  - Ordered BMP for medication monitoring  - Discussed low sodium diets, weight loss    CKD stage III - Most recent creatinine was 1.11 on 04/17/23  - Ordered repeat BMP as above    HLD  - LDL 62 in 02/2023 - Continue lipitor 20 mg daily   Obesity  - BMI 40.19  - Discussed weight loss     Dispo: Follow up in 4-6 weeks   Signed, Jonita Albee, PA-C

## 2023-05-23 DIAGNOSIS — H5213 Myopia, bilateral: Secondary | ICD-10-CM | POA: Diagnosis not present

## 2023-05-23 DIAGNOSIS — Z961 Presence of intraocular lens: Secondary | ICD-10-CM | POA: Diagnosis not present

## 2023-05-23 DIAGNOSIS — H10413 Chronic giant papillary conjunctivitis, bilateral: Secondary | ICD-10-CM | POA: Diagnosis not present

## 2023-05-23 DIAGNOSIS — H04123 Dry eye syndrome of bilateral lacrimal glands: Secondary | ICD-10-CM | POA: Diagnosis not present

## 2023-05-24 ENCOUNTER — Ambulatory Visit: Payer: Medicare Other | Attending: Cardiology | Admitting: Cardiology

## 2023-05-24 ENCOUNTER — Other Ambulatory Visit: Payer: Self-pay | Admitting: Cardiology

## 2023-05-24 ENCOUNTER — Encounter: Payer: Self-pay | Admitting: Cardiology

## 2023-05-24 VITALS — BP 120/84 | HR 68 | Ht 66.0 in | Wt 249.0 lb

## 2023-05-24 DIAGNOSIS — R600 Localized edema: Secondary | ICD-10-CM | POA: Diagnosis not present

## 2023-05-24 DIAGNOSIS — N1832 Chronic kidney disease, stage 3b: Secondary | ICD-10-CM | POA: Insufficient documentation

## 2023-05-24 DIAGNOSIS — I10 Essential (primary) hypertension: Secondary | ICD-10-CM | POA: Insufficient documentation

## 2023-05-24 DIAGNOSIS — I5032 Chronic diastolic (congestive) heart failure: Secondary | ICD-10-CM | POA: Insufficient documentation

## 2023-05-24 DIAGNOSIS — E782 Mixed hyperlipidemia: Secondary | ICD-10-CM | POA: Insufficient documentation

## 2023-05-24 MED ORDER — IRBESARTAN 150 MG PO TABS: 225.0000 mg | ORAL_TABLET | Freq: Every day | ORAL | 3 refills | Status: DC

## 2023-05-24 NOTE — Patient Instructions (Signed)
Medication Instructions:  Stop Amlodipine Increase Irbesartan to 225 mg (1 1/2 tablet) once a day  *If you need a refill on your cardiac medications before your next appointment, please call your pharmacy*   Lab Work: Today we will draw BMP and BNP If you have labs (blood work) drawn today and your tests are completely normal, you will receive your results only by: MyChart Message (if you have MyChart) OR A paper copy in the mail If you have any lab test that is abnormal or we need to change your treatment, we will call you to review the results.   Testing/Procedures: Your physician has requested that you have an echocardiogram. Echocardiography is a painless test that uses sound waves to create images of your heart. It provides your doctor with information about the size and shape of your heart and how well your heart's chambers and valves are working. This procedure takes approximately one hour. There are no restrictions for this procedure. Please do NOT wear cologne, perfume, aftershave, or lotions (deodorant is allowed). Please arrive 15 minutes prior to your appointment time.  Please note: We ask at that you not bring children with you during ultrasound (echo/ vascular) testing. Due to room size and safety concerns, children are not allowed in the ultrasound rooms during exams. Our front office staff cannot provide observation of children in our lobby area while testing is being conducted. An adult accompanying a patient to their appointment will only be allowed in the ultrasound room at the discretion of the ultrasound technician under special circumstances. We apologize for any inconvenience.    Follow-Up: At [facility], you and your health needs are our priority.  As part of our continuing mission to provide you with exceptional heart care, we have created designated Provider Care Teams.  These Care Teams include your primary Cardiologist (physician) and Advanced Practice  Providers (APPs -  Physician Assistants and Nurse Practitioners) who all work together to provide you with the care you need, when you need it.  We recommend signing up for the patient portal called "MyChart".  Sign up information is provided on this After Visit Summary.  MyChart is used to connect with patients for Virtual Visits (Telemedicine).  Patients are able to view lab/test results, encounter notes, upcoming appointments, etc.  Non-urgent messages can be sent to your provider as well.   To learn more about what you can do with MyChart, go to [email or web address].    Your next appointment:   4-6 week(s)  Provider:   Robet Leu, PA-C or Any APP

## 2023-05-25 LAB — BASIC METABOLIC PANEL
BUN/Creatinine Ratio: 11 — ABNORMAL LOW (ref 12–28)
BUN: 14 mg/dL (ref 8–27)
CO2: 23 mmol/L (ref 20–29)
Calcium: 9.5 mg/dL (ref 8.7–10.3)
Chloride: 107 mmol/L — ABNORMAL HIGH (ref 96–106)
Creatinine, Ser: 1.33 mg/dL — ABNORMAL HIGH (ref 0.57–1.00)
Glucose: 89 mg/dL (ref 70–99)
Potassium: 4.5 mmol/L (ref 3.5–5.2)
Sodium: 148 mmol/L — ABNORMAL HIGH (ref 134–144)
eGFR: 40 mL/min/{1.73_m2} — ABNORMAL LOW (ref >59)

## 2023-05-25 LAB — BRAIN NATRIURETIC PEPTIDE: BNP: 31.3 pg/mL (ref 0.0–100.0)

## 2023-05-26 ENCOUNTER — Telehealth: Payer: Self-pay

## 2023-05-26 DIAGNOSIS — I10 Essential (primary) hypertension: Secondary | ICD-10-CM

## 2023-05-26 NOTE — Telephone Encounter (Signed)
-----   Message from Jonita Albee sent at 05/26/2023  7:29 AM EST ----- Please tell patient that her lab work actually suggests that she may be a bit dehydrated/over diuresed. Her BNP is normal, suggesting that she does not have extra fluid on her from heart failure. Likely ankle swelling was a side effect of amlodipine. With her mild elevations in creatinine, sodium, and normal BNP, I do not think she needs to increase her lasix.   She should continue her current lasix regiment for now (lasix 40 mg on MWF, 80 mg every other day), recheck BMP in 2 weeks.   Thanks [employer]

## 2023-05-26 NOTE — Telephone Encounter (Signed)
Called patient advised of below they verbalized understanding.

## 2023-05-31 DIAGNOSIS — J4550 Severe persistent asthma, uncomplicated: Secondary | ICD-10-CM | POA: Diagnosis not present

## 2023-06-09 ENCOUNTER — Other Ambulatory Visit: Payer: Self-pay

## 2023-06-09 DIAGNOSIS — I10 Essential (primary) hypertension: Secondary | ICD-10-CM | POA: Diagnosis not present

## 2023-06-10 LAB — BASIC METABOLIC PANEL
BUN/Creatinine Ratio: 14 (ref 12–28)
BUN: 17 mg/dL (ref 8–27)
CO2: 24 mmol/L (ref 20–29)
Calcium: 9.3 mg/dL (ref 8.7–10.3)
Chloride: 107 mmol/L — ABNORMAL HIGH (ref 96–106)
Creatinine, Ser: 1.19 mg/dL — ABNORMAL HIGH (ref 0.57–1.00)
Glucose: 94 mg/dL (ref 70–99)
Potassium: 4.5 mmol/L (ref 3.5–5.2)
Sodium: 145 mmol/L — ABNORMAL HIGH (ref 134–144)
eGFR: 45 mL/min/{1.73_m2} — ABNORMAL LOW (ref >59)

## 2023-06-12 ENCOUNTER — Telehealth: Payer: Self-pay

## 2023-06-12 NOTE — Telephone Encounter (Signed)
-----   Message from Jonita Albee sent at 06/12/2023 12:20 PM EST ----- Please tell patient that her lab work showed stable kidney function, normal potassium. Sodium remains very minimally elevated, so I suspect her hydration is improving. Continue current medications  Thanks KJ

## 2023-06-12 NOTE — Telephone Encounter (Signed)
Called patient advised of below they verbalized understanding.

## 2023-06-20 NOTE — Progress Notes (Unsigned)
Cardiology Office Note:  .   Date:  06/21/2023  ID:  Brittany [name], DOB [birth year], MRN [id number] PCP: Georgianne Fick, MD  [facility] Providers Cardiologist:  Thurmon Fair, MD  History of Present Illness: .   Brittany [name] is a 82 y.o. female with past medical history of chronic diastolic heart failure, hypertension, CKD stage IIIa, gout, obesity, HLD.  Patient is followed by Dr. Royann Shivers with presents today for an annual follow-up appointment.   Per chart review, patient previously underwent echocardiogram on 10/08/2020 that showed calculated LVEF 64%, normal LV size with borderline wall thickness, grade 1 diastolic dysfunction, no significant valvular abnormalities.  She established care with Dr. Royann Shivers in 10/2020 for evaluation of shortness of breath and edema.  Her Lasix was increased and she felt better.  Overall, her presentation and workup was consistent with diastolic heart failure, likely due to history of hypertension.   I saw patient in clinic on 03/30/23. 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  Patient also reported having intermittent left arm pain and worsening ankle edema.  Was on infusions for her gout.  Lasix increased to 80 mg and 40 mg alternating days. Patient kept a BP log at home that showed BP ranging from the 130s/70s-180s/90s.  Systolic blood pressure was predominantly in the 140s/150s.  Was started on amlodipine.  Called the office on 11/1 complaining of worsening lower extremity edema.  Was scheduled for an appointment. At her appointment, her amlodipine was stopped and her irbesartan was increased to 225 mg daily. Her BNP was within normal limits.   Today, patient presents for a BP check and follow up appointment. Reports that she has not really been checking her BP at home because she does not think that her BP cuff is accurate. Admits that she  really hasn't checked her BP at home since her amlodipine was stopped and irbesartan was increased about a month ago. However, when she recently went to get an injection last week, her BP was in the 120s systolic. She denies dizziness, syncope, near syncope. Denies chest pain. She continues to have lower extremity swelling, more significant in her left leg than her right. She reports that her left leg always has swollen more than her right. When she was on amlodipine, her swelling was significantly worse than it is now. Since stopping the amlodipine, her swelling has improved and is back to her baseline. Reports that swelling is fairly stable. She admits to not following a low salt diet. Notices that swelling seems to improve if she elevates her feet. She denies shortness of breath. Continues to walk about 4 mornings per week and has been tolerating physical activity well   ROS: Denies dizziness, syncope, near syncope, shortness of breath, orthopnea. Continues to have ankle swelling   Studies Reviewed: .   Cardiac Studies & Procedures     STRESS TESTS  MYOCARDIAL PERFUSION IMAGING 04/06/2023  Narrative   LV perfusion is normal. There is no evidence of ischemia. There is no evidence of infarction.   Left ventricular function is normal. Nuclear stress EF: 51%. The left ventricular ejection fraction is mildly decreased (45-54%). End diastolic cavity size is normal.   The study is normal. The study is low risk.              Risk Assessment/Calculations:  Physical Exam:   VS:  BP 138/74 (BP Location: Left Arm, Patient Position: Sitting, Cuff Size: Normal)   Pulse 72   Ht 5' 6.5" (1.689 m)   Wt 253 lb 6.4 oz (114.9 kg)   SpO2 92%   BMI 40.29 kg/m    Wt Readings from Last 3 Encounters:  06/21/23 253 lb 6.4 oz (114.9 kg)  05/24/23 249 lb (112.9 kg)  04/06/23 251 lb (113.9 kg)    GEN: Well nourished, well developed in no acute distress. Sitting comfortably on the exam table   NECK: No JVD; No carotid bruits CARDIAC: RRR. Faint systolic murmur at RUSB RESPIRATORY:  Clear to auscultation without rales, wheezing or rhonchi. Normal work of breathing on room air  ABDOMEN: Soft, non-tender, non-distended EXTREMITIES:  1+ edema in BLE, No deformity   ASSESSMENT AND PLAN: .    Chronic diastolic heart failure Lower extremity edema  -Echocardiogram from 09/2020 showed EF 64%, grade 1 diastolic dysfunction - Patient was started on amlodipine for elevated BP in 03/2023. However, she had worsening lower extremity swelling. Amlodipine was stopped. BNP normal on 11/6  - Since stopping amlodipine, her ankle swelling has improved and she reports it is back to baseline. Continues to have mild bilateral ankle swelling that has been stable, improves if she elevates her feet. She is otherwise euvolemic on exam and denies shortness of breath  - Continue lasix 40 mg MWF, 80 mg every other day. Renal function has been stable on this regiment  - Discussed importance of low-sodium diet - patient admits that she does not always follow low sodium diet  - Discussed elevating feet whenever she is resting and to consider compression stockings  - I had previously ordered echocardiogram with worsening symptoms of hypervolemia. Scheduled for 12/16    Hypertension - Currently on irbesartan 225 mg daily, lasix 40 mg MWF and 80 mg every other day, guanfacine 2 mg nightly  - Patient has not been checking her BP at home. Last week, reports that her BP was in the 120 systolic when she went in for an injection. BP controlled today in clinic  - Creatinine was 1.19  and K 4.5 on 06/09/23 - Continue current regiment  - As above, discussed low sodium diets  - Patient has not been checking her BP at home because she does not think her BP cuff is accurate. Encouraged patient to get a new BP cuff and to continue to monitor BP cuff at home.   CKD stage III - Most recent creatinine was 1.19 on 11/22. This is  similar to her baseline    HLD  - LDL 62 in 02/2023 - Continue lipitor 20 mg daily   Obesity  - BMI 40.29  - Discussed weight loss techniques including increasing physical activity as tolerated, following high fiber diets     Dispo: Follow up in 6 months with Dr. Royann Shivers   Signed, Jonita Albee, PA-C

## 2023-06-21 ENCOUNTER — Encounter: Payer: Self-pay | Admitting: Cardiology

## 2023-06-21 ENCOUNTER — Ambulatory Visit: Payer: Medicare Other | Attending: Cardiology | Admitting: Cardiology

## 2023-06-21 VITALS — BP 138/74 | HR 72 | Ht 66.5 in | Wt 253.4 lb

## 2023-06-21 DIAGNOSIS — N1832 Chronic kidney disease, stage 3b: Secondary | ICD-10-CM | POA: Insufficient documentation

## 2023-06-21 DIAGNOSIS — R600 Localized edema: Secondary | ICD-10-CM | POA: Diagnosis not present

## 2023-06-21 DIAGNOSIS — E782 Mixed hyperlipidemia: Secondary | ICD-10-CM | POA: Diagnosis not present

## 2023-06-21 DIAGNOSIS — I10 Essential (primary) hypertension: Secondary | ICD-10-CM | POA: Insufficient documentation

## 2023-06-21 DIAGNOSIS — I5032 Chronic diastolic (congestive) heart failure: Secondary | ICD-10-CM | POA: Insufficient documentation

## 2023-06-21 NOTE — Patient Instructions (Signed)
Medication Instructions:  Your physician recommends that you continue on your current medications as directed. Please refer to the Current Medication list given to you today.  *If you need a refill on your cardiac medications before your next appointment, please call your pharmacy*  Lab Work: No labs  Testing/Procedures: No testing  Follow-Up: At [facility], you and your health needs are our priority.  As part of our continuing mission to provide you with exceptional heart care, we have created designated Provider Care Teams.  These Care Teams include your primary Cardiologist (physician) and Advanced Practice Providers (APPs -  Physician Assistants and Nurse Practitioners) who all work together to provide you with the care you need, when you need it.  We recommend signing up for the patient portal called "MyChart".  Sign up information is provided on this After Visit Summary.  MyChart is used to connect with patients for Virtual Visits (Telemedicine).  Patients are able to view lab/test results, encounter notes, upcoming appointments, etc.  Non-urgent messages can be sent to your provider as well.   To learn more about what you can do with MyChart, go to [email or web address].    Your next appointment:   6 month(s)  Provider:   Thurmon Fair, MD

## 2023-06-28 DIAGNOSIS — J4550 Severe persistent asthma, uncomplicated: Secondary | ICD-10-CM | POA: Diagnosis not present

## 2023-06-30 ENCOUNTER — Other Ambulatory Visit: Payer: Self-pay | Admitting: Internal Medicine

## 2023-06-30 DIAGNOSIS — Z1231 Encounter for screening mammogram for malignant neoplasm of breast: Secondary | ICD-10-CM

## 2023-07-03 ENCOUNTER — Ambulatory Visit (HOSPITAL_COMMUNITY): Payer: Medicare Other | Attending: Cardiology

## 2023-07-03 DIAGNOSIS — I5032 Chronic diastolic (congestive) heart failure: Secondary | ICD-10-CM | POA: Diagnosis present

## 2023-07-03 LAB — ECHOCARDIOGRAM COMPLETE
Area-P 1/2: 1.88 cm2
S' Lateral: 3.3 cm

## 2023-07-04 ENCOUNTER — Telehealth: Payer: Self-pay

## 2023-07-04 NOTE — Telephone Encounter (Signed)
Patient returned staff call regarding results.

## 2023-07-04 NOTE — Telephone Encounter (Signed)
-----   Message from Jonita Albee sent at 07/04/2023  1:10 PM EST ----- Please tell patient that her echocardiogram showed EF (pumping function of the heart) was normal at 60-65%. There were no wall motion abnormalities. Heart muscle is a bit thick, which can happen with age and history of high blood pressure. When the heart muscle is thick, the heart cannot relax as well which can contribute to fluid retention. Important to follow a low sodium diet like we discussed at your most recent appointment   RV functioning normally. No significant valvular abnormalities. Aorta is very mildly dilated, which is something we will monitor in the future. She should continue current medications and follow up as arranged   Thanks KJ

## 2023-07-04 NOTE — Telephone Encounter (Signed)
Called patient advised of below they verbalized understanding.

## 2023-07-04 NOTE — Telephone Encounter (Signed)
Left message to call back  

## 2023-07-21 DIAGNOSIS — L501 Idiopathic urticaria: Secondary | ICD-10-CM | POA: Diagnosis not present

## 2023-07-26 DIAGNOSIS — J4550 Severe persistent asthma, uncomplicated: Secondary | ICD-10-CM | POA: Diagnosis not present

## 2023-08-01 DIAGNOSIS — M1990 Unspecified osteoarthritis, unspecified site: Secondary | ICD-10-CM | POA: Diagnosis not present

## 2023-08-01 DIAGNOSIS — M25521 Pain in right elbow: Secondary | ICD-10-CM | POA: Diagnosis not present

## 2023-08-01 DIAGNOSIS — Z79899 Other long term (current) drug therapy: Secondary | ICD-10-CM | POA: Diagnosis not present

## 2023-08-01 DIAGNOSIS — M7701 Medial epicondylitis, right elbow: Secondary | ICD-10-CM | POA: Diagnosis not present

## 2023-08-01 DIAGNOSIS — N1832 Chronic kidney disease, stage 3b: Secondary | ICD-10-CM | POA: Diagnosis not present

## 2023-08-01 DIAGNOSIS — M1A9XX1 Chronic gout, unspecified, with tophus (tophi): Secondary | ICD-10-CM | POA: Diagnosis not present

## 2023-08-14 ENCOUNTER — Ambulatory Visit
Admission: RE | Admit: 2023-08-14 | Discharge: 2023-08-14 | Disposition: A | Discharge status: Home / Self Care | Payer: Medicare Other | Source: Ambulatory Visit | Attending: Internal Medicine | Admitting: Internal Medicine

## 2023-08-14 DIAGNOSIS — Z1231 Encounter for screening mammogram for malignant neoplasm of breast: Secondary | ICD-10-CM | POA: Diagnosis not present

## 2023-08-23 DIAGNOSIS — J4550 Severe persistent asthma, uncomplicated: Secondary | ICD-10-CM | POA: Diagnosis not present

## 2023-09-01 ENCOUNTER — Other Ambulatory Visit: Payer: Self-pay | Admitting: Cardiology

## 2023-09-05 DIAGNOSIS — I129 Hypertensive chronic kidney disease with stage 1 through stage 4 chronic kidney disease, or unspecified chronic kidney disease: Secondary | ICD-10-CM | POA: Diagnosis not present

## 2023-09-05 DIAGNOSIS — M1A0790 Idiopathic chronic gout, unspecified ankle and foot, without tophus (tophi): Secondary | ICD-10-CM | POA: Diagnosis not present

## 2023-09-05 DIAGNOSIS — N1832 Chronic kidney disease, stage 3b: Secondary | ICD-10-CM | POA: Diagnosis not present

## 2023-09-05 DIAGNOSIS — E890 Postprocedural hypothyroidism: Secondary | ICD-10-CM | POA: Diagnosis not present

## 2023-09-05 DIAGNOSIS — E782 Mixed hyperlipidemia: Secondary | ICD-10-CM | POA: Diagnosis not present

## 2023-09-05 DIAGNOSIS — I5032 Chronic diastolic (congestive) heart failure: Secondary | ICD-10-CM | POA: Diagnosis not present

## 2023-09-05 DIAGNOSIS — I10 Essential (primary) hypertension: Secondary | ICD-10-CM | POA: Diagnosis not present

## 2023-09-11 ENCOUNTER — Telehealth: Payer: Self-pay | Admitting: Pharmacy Technician

## 2023-09-11 ENCOUNTER — Telehealth: Payer: Self-pay | Admitting: Cardiovascular Disease

## 2023-09-11 ENCOUNTER — Other Ambulatory Visit (HOSPITAL_COMMUNITY): Payer: Self-pay

## 2023-09-11 NOTE — Telephone Encounter (Signed)
 Called and spoke to pharmacy   Pharmacy states:   -patient picked up 90 day supply of Irbesartan on 12/28  -patient insurance only covers 1 tablet a day not 1.5 of Irbesartan   -patient can pay $19.03 for a 30 day supply (45 tablets) to continue 1.5 tab daily   -office can try submitting a PA for the 1.5 tablet    Patient identification verified by 2 forms. Marilynn Rail, RN    Called and spoke to patient  Patient states:   -has enough Rx to continue taking 1.5 tablet until Thursday   -she is able to cover $19.03 for the 30 day supply  Informed patient:   -message sent to PA team for assistance  Patient verbalized understanding, no questions at this time

## 2023-09-11 NOTE — Telephone Encounter (Signed)
 Pharmacy Patient Advocate Encounter   Received notification from Pt Calls Messages that prior authorization for irbesartan is required/requested.   Insurance verification completed.   The patient is insured through [employer] .   Per test claim: PA required; PA submitted to above mentioned insurance via CoverMyMeds Key/confirmation #/EOC [id number] Status is pending

## 2023-09-11 NOTE — Telephone Encounter (Signed)
 Pt c/o medication issue:  1. Name of Medication:   irbesartan (AVAPRO) 150 MG tablet    2. How are you currently taking this medication (dosage and times per day)?    3. Are you having a reaction (difficulty breathing--STAT)? no  4. What is your medication issue? Patient states she is unable to get medication filled. Pharmacy states that its too earlier. Please advise

## 2023-09-11 NOTE — Telephone Encounter (Signed)
 Pharmacy Patient Advocate Encounter  Received notification from [employer] that Prior Authorization for irbesartan qty limits approved has been APPROVED from 07/19/23 to 07/17/24 . I spoke to her pharmacy and copay is 30.75 for 90day   PA #/Case ID/Reference #: [id number]

## 2023-09-14 DIAGNOSIS — N1832 Chronic kidney disease, stage 3b: Secondary | ICD-10-CM | POA: Diagnosis not present

## 2023-09-14 DIAGNOSIS — I130 Hypertensive heart and chronic kidney disease with heart failure and stage 1 through stage 4 chronic kidney disease, or unspecified chronic kidney disease: Secondary | ICD-10-CM | POA: Diagnosis not present

## 2023-09-14 DIAGNOSIS — M1A0790 Idiopathic chronic gout, unspecified ankle and foot, without tophus (tophi): Secondary | ICD-10-CM | POA: Diagnosis not present

## 2023-09-14 DIAGNOSIS — R600 Localized edema: Secondary | ICD-10-CM | POA: Diagnosis not present

## 2023-09-14 DIAGNOSIS — L501 Idiopathic urticaria: Secondary | ICD-10-CM | POA: Diagnosis not present

## 2023-09-14 DIAGNOSIS — Z0000 Encounter for general adult medical examination without abnormal findings: Secondary | ICD-10-CM | POA: Diagnosis not present

## 2023-09-14 DIAGNOSIS — E890 Postprocedural hypothyroidism: Secondary | ICD-10-CM | POA: Diagnosis not present

## 2023-09-14 DIAGNOSIS — I129 Hypertensive chronic kidney disease with stage 1 through stage 4 chronic kidney disease, or unspecified chronic kidney disease: Secondary | ICD-10-CM | POA: Diagnosis not present

## 2023-09-14 DIAGNOSIS — E782 Mixed hyperlipidemia: Secondary | ICD-10-CM | POA: Diagnosis not present

## 2023-09-14 DIAGNOSIS — I10 Essential (primary) hypertension: Secondary | ICD-10-CM | POA: Diagnosis not present

## 2023-09-14 DIAGNOSIS — I5032 Chronic diastolic (congestive) heart failure: Secondary | ICD-10-CM | POA: Diagnosis not present

## 2023-09-21 DIAGNOSIS — J4550 Severe persistent asthma, uncomplicated: Secondary | ICD-10-CM | POA: Diagnosis not present

## 2023-10-19 DIAGNOSIS — J4550 Severe persistent asthma, uncomplicated: Secondary | ICD-10-CM | POA: Diagnosis not present

## 2023-11-10 DIAGNOSIS — M1A9XX1 Chronic gout, unspecified, with tophus (tophi): Secondary | ICD-10-CM | POA: Diagnosis not present

## 2023-11-10 DIAGNOSIS — N1832 Chronic kidney disease, stage 3b: Secondary | ICD-10-CM | POA: Diagnosis not present

## 2023-11-10 DIAGNOSIS — Z79899 Other long term (current) drug therapy: Secondary | ICD-10-CM | POA: Diagnosis not present

## 2023-11-10 DIAGNOSIS — M1990 Unspecified osteoarthritis, unspecified site: Secondary | ICD-10-CM | POA: Diagnosis not present

## 2023-11-22 DIAGNOSIS — J4550 Severe persistent asthma, uncomplicated: Secondary | ICD-10-CM | POA: Diagnosis not present

## 2023-12-20 DIAGNOSIS — J4550 Severe persistent asthma, uncomplicated: Secondary | ICD-10-CM | POA: Diagnosis not present

## 2024-01-15 ENCOUNTER — Ambulatory Visit: Attending: Cardiovascular Disease | Admitting: Cardiovascular Disease

## 2024-01-15 ENCOUNTER — Encounter: Payer: Self-pay | Admitting: Cardiovascular Disease

## 2024-01-15 VITALS — BP 151/91 | HR 58 | Ht 66.5 in | Wt 248.6 lb

## 2024-01-15 DIAGNOSIS — I10 Essential (primary) hypertension: Secondary | ICD-10-CM | POA: Diagnosis not present

## 2024-01-15 DIAGNOSIS — I5032 Chronic diastolic (congestive) heart failure: Secondary | ICD-10-CM | POA: Insufficient documentation

## 2024-01-15 NOTE — Progress Notes (Signed)
 ekg

## 2024-01-15 NOTE — Patient Instructions (Signed)
 Medication Instructions:  No changes *If you need a refill on your cardiac medications before your next appointment, please call your pharmacy*  Lab Work: None ordered If you have labs (blood work) drawn today and your tests are completely normal, you will receive your results only by: MyChart Message (if you have MyChart) OR A paper copy in the mail If you have any lab test that is abnormal or we need to change your treatment, we will call you to review the results.  Testing/Procedures: WatchPAT?  Is a FDA cleared portable home sleep study test that uses a watch and 3 points of contact to monitor 7 different channels, including your heart rate, oxygen saturations, body position, snoring, and chest motion.  The study is easy to use from the comfort of your own home and accurately detect sleep apnea.  Before bed, you attach the chest sensor, attached the sleep apnea bracelet to your nondominant hand, and attach the finger probe.  After the study, the raw data is downloaded from the watch and scored for apnea events.   For more information: [email or web address]  Patient Testing Instructions:  Do not put battery into the device until bedtime when you are ready to begin the test. Please call the support number if you need assistance after following the instructions below: 24 hour support line- [phone] or [facility] support at [phone] (option 2)  Download the [facility] WatchPAT One app through the google play store or App Store  Be sure to turn on or enable access to bluetooth in settlings on your smartphone/ device  Make sure no other bluetooth devices are on and within the vicinity of your smartphone/ device and WatchPAT watch during testing.  Make sure to leave your smart phone/ device plugged in and charging all night.  When ready for bed:  Follow the instructions step by step in the WatchPAT One App to activate the testing device. For additional instructions,  including video instruction, visit the WatchPAT One video on Youtube. You can search for WatchPat One within Youtube (video is 4 minutes and 18 seconds) or enter: https://youtube/[email or web address]?[email or web address]_[email or web address] Please note: You will be prompted to enter a Pin to connect via bluetooth when starting the test. The PIN will be assigned to you when you receive the test.  The device is disposable, but it recommended that you retain the device until you receive a call letting you know the study has been received and the results have been interpreted.  We will let you know if the study did not transmit to us  properly after the test is completed. You do not need to call us  to confirm the receipt of the test.  Please complete the test within 48 hours of receiving PIN.   Frequently Asked Questions:  What is Watch Bruna one?  A single use fully disposable home sleep apnea testing device and will not need to be returned after completion.  What are the requirements to use WatchPAT one?  The be able to have a successful watchpat one sleep study, you should have your Watch pat one device, your smart phone, watch pat one app, your PIN number and Internet access What type of phone do I need?  You should have a smart phone that uses Android 5.1 and above or any Iphone with IOS 10 and above How can I download the WatchPAT one app?  Based on your device type search for WatchPAT one app either in google play for android devices or APP store for Iphone's Where will  I get my PIN for the study?  Your PIN will be provided by your physician's office. It is used for authentication and if you lose/forget your PIN, please reach out to your providers office.  I do not have Internet at home. Can I do WatchPAT one study?  WatchPAT One needs Internet connection throughout the night to be able to transmit the sleep data. You can use your home/local internet or your cellular's data package. However, it is always recommended to use home/local  Internet. It is estimated that between 20MB-30MB will be used with each study.However, the application will be looking for space in the phone to start the study.  What happens if I lose internet or bluetooth connection?  During the internet disconnection, your phone will not be able to transmit the sleep data. All the data, will be stored in your phone. As soon as the internet connection is back on, the phone will being sending the sleep data. During the bluetooth disconnection, WatchPAT one will not be able to to send the sleep data to your phone. Data will be kept in the WatchPAT one until two devices have bluetooth connection back on. As soon as the connection is back on, WatchPAT one will send the sleep data to the phone.  How long do I need to wear the WatchPAT one?  After you start the study, you should wear the device at least 6 hours.  How far should I keep my phone from the device?  During the night, your phone should be within 15 feet.  What happens if I leave the room for restroom or other reasons?  Leaving the room for any reason will not cause any problem. As soon as your get back to the room, both devices will reconnect and will continue to send the sleep data. Can I use my phone during the sleep study?  Yes, you can use your phone as usual during the study. But it is recommended to put your watchpat one on when you are ready to go to bed.  How will I get my study results?  A soon as you completed your study, your sleep data will be sent to the provider. They will then share the results with you when they are ready.    Follow-Up: At [facility], you and your health needs are our priority.  As part of our continuing mission to provide you with exceptional heart care, our providers are all part of one team.  This team includes your primary Cardiologist (physician) and Advanced Practice Providers or APPs (Physician Assistants and Nurse Practitioners) who all work together to  provide you with the care you need, when you need it.  Your next appointment:   1 year(s)  Provider:   Jerel Balding, MD    We recommend signing up for the patient portal called MyChart.  Sign up information is provided on this After Visit Summary.  MyChart is used to connect with patients for Virtual Visits (Telemedicine).  Patients are able to view lab/test results, encounter notes, upcoming appointments, etc.  Non-urgent messages can be sent to your provider as well.   To learn more about what you can do with MyChart, go to [email or web address].

## 2024-01-15 NOTE — Progress Notes (Signed)
 Cardiology Office Note:    Date:  01/15/2024   ID:  Adryanna, Friedt [birth year], MRN [id number]  PCP:  Verdia Lombard, MD   [facility]  Cardiologist:  Jerel Balding, MD  Advanced Practice Provider:  No care team member to display Electrophysiologist:  None       Referring MD: Verdia Lombard, MD   Chief Complaint  Patient presents with   Congestive Heart Failure   History of Present Illness:    Brittany [name] is a 83 y.o. female with a hx of chronic diastolic heart failure, HTN, HLP, gout and hypothyroidism whose husband Velma is also my patient (they both have appointments today).  She is generally doing well from a cardiovascular point of view.  Continues to have NYHA functional class II exertional dyspnea.  She has no trouble walking on level ground, but becomes short of breath.  No difficulty with household chores.  She rarely has swelling in her ankles (she did not tolerate amlodipine  which caused severe swelling).  She has not had wheezing, which was manifest before we started her on loop diuretics.  She has not had any recent gout attacks.  Denies orthopnea, PND, palpitations, dizziness or syncope.  She complains of numbness in her toes at night.  She has been prescribed gabapentin but is taking this in the morning.  She has poor quality sleep.  She feels tired during the day and often falls asleep in the afternoon.  She weighs just under 249 pounds, close to her previous estimated dry weight of 250 pounds.  Her blood pressure is high today, but just a few days ago was well-controlled at 128/70.  She is taking 1.5 tablets of irbesartan  150 mg daily, as well as guanfacine 1 mg twice daily.     An echocardiogram was performed at [facility] which showed normal left ventricular size and systolic function with a calculated EF of 64%.  There was borderline wall thickness (septum 1.2 cm, posterior 1.17 cm) and  diastolic dysfunction  with impaired relaxation pattern (E/A was 0.68 but deceleration time was normal and E/e' was normal at 6-7).  The left atrium was described as slightly dilated (end-systolic diameter 4.14 cm) the TR velocity was normal at 2.15 ms and there were no major valvular abnormalities described.    Past Medical History:  Diagnosis Date   Gout    Hypercholesterolemia    Hypertension    Hypothyroid     History reviewed. No pertinent surgical history.  Current Medications: Current Meds  Medication Sig   allopurinol (ZYLOPRIM) 100 MG tablet Take 100 mg by mouth daily.   aspirin EC 81 MG tablet Take 81 mg by mouth daily.   atorvastatin (LIPITOR) 20 MG tablet Take 20 mg by mouth daily.   clonazePAM (KLONOPIN) 0.5 MG tablet Take 0.5 mg by mouth daily as needed.   colchicine 0.6 MG tablet Take 0.6 mg by mouth daily as needed (for gout flare ups).   EPINEPHrine  (EPIPEN  2-PAK) 0.3 mg/0.3 mL IJ SOAJ injection Inject 0.3 mLs (0.3 mg total) into the muscle once.   furosemide  (LASIX ) 40 MG tablet TAKE 1 TABLET (40 MG TOTAL) BY MOUTH DAILY. TAKE 40 MG MONDAY, WEDNESDAY, FRIDAYS. TAKE 80 MG EVERY OTHER DAY.   gabapentin (NEURONTIN) 300 MG capsule Take 1 capsule by mouth daily as needed.   guanFACINE (TENEX) 1 MG tablet Take 2 mg by mouth at bedtime.   irbesartan  (AVAPRO ) 150 MG tablet Take 1.5 tablets (  225 mg total) by mouth daily.   omalizumab  (XOLAIR ) 150 MG/ML prefilled syringe See admin instructions.   potassium chloride (KLOR-CON) 10 MEQ tablet Take 10 mEq by mouth daily.   SYNTHROID 125 MCG tablet Take 125 mcg by mouth daily.     Allergies:   Patient has no known allergies.   Social History   Socioeconomic History   Marital status: Married    Spouse name: Not on file   Number of children: Not on file   Years of education: Not on file   Highest education level: Not on file  Occupational History   Not on file  Tobacco Use   Smoking status: Former   Smokeless tobacco:  Never  Substance and Sexual Activity   Alcohol use: Never   Drug use: Not on file   Sexual activity: Not on file  Other Topics Concern   Not on file  Social History Narrative   Not on file   Social Drivers of Health   Financial Resource Strain: Not on file  Food Insecurity: Not on file  Transportation Needs: Not on file  Physical Activity: Not on file  Stress: Not on file  Social Connections: Not on file     Family History: The patient's family history is negative for Breast cancer., CAD, CHF  ROS:   Please see the history of present illness.     All other systems reviewed and are negative.  EKGs/Labs/Other Studies Reviewed:    The following studies were reviewed today: Echocardiogram from 10/09/2018   Labs from 09/28/2020, including hemoglobin 12.6, BUN 23, creatinine 1.36, potassium 4.7, normal liver function tests, BNP 84 Chest x-ray PA and lateral from 09/28/2020 which showed no acute disease and the presence of aortic atherosclerosis and emphysema. CT angiogram of the chest in 2013 was performed for pulmonary embolism.  Did not show any evidence of pulmonary embolism and there was no report of coronary or aortic atherosclerosis.  EKG:  EKG is not ordered today.  The ekg ordered at her previous appointment demonstrates normal sinus rhythm, LVH by voltage criteria only, no repolarization abnormalities.  Recent Labs: 05/24/2023: BNP 31.3 06/09/2023: BUN 17; Creatinine, Ser 1.19; Potassium 4.5; Sodium 145    Recent Lipid Panel No results found for: CHOL, TRIG, HDL, CHOLHDL, VLDL, LDLCALC, LDLDIRECT  Labs from Dr. Verdia 09/05/2023 Cholesterol 152, HDL 64, LDL 71, triglycerides 95 Hemoglobin 13.7, creatinine 1.3, potassium 4.5, ALT 13, TSH 1.42  Risk Assessment/Calculations:       Physical Exam:    VS:  BP (!) 151/91   Pulse (!) 58   Ht 5' 6.5 (1.689 m)   Wt 248 lb 9.6 oz (112.8 kg)   SpO2 97%   BMI 39.52 kg/m     Wt Readings from  Last 3 Encounters:  01/15/24 248 lb 9.6 oz (112.8 kg)  06/21/23 253 lb 6.4 oz (114.9 kg)  05/24/23 249 lb (112.9 kg)      General: Alert, oriented x3, no distress, borderline morbidly obese Head: no evidence of trauma, PERRL, EOMI, no exophtalmos or lid lag, no myxedema, no xanthelasma; normal ears, nose and oropharynx Neck: normal jugular venous pulsations and no hepatojugular reflux; brisk carotid pulses without delay and no carotid bruits Chest: clear to auscultation, no signs of consolidation by percussion or palpation, normal fremitus, symmetrical and full respiratory excursions Cardiovascular: normal position and quality of the apical impulse, regular rhythm, normal first and second heart sounds, no murmurs, rubs or gallops Abdomen: no tenderness or distention, no  masses by palpation, no abnormal pulsatility or arterial bruits, normal bowel sounds, no hepatosplenomegaly Extremities: no clubbing, cyanosis or edema; 2+ radial, ulnar and brachial pulses bilaterally; 2+ right femoral, posterior tibial and dorsalis pedis pulses; 2+ left femoral, posterior tibial and dorsalis pedis pulses; no subclavian or femoral bruits Neurological: grossly nonfocal Psych: Normal mood and affect    ASSESSMENT:    1. Essential hypertension   2. Chronic diastolic heart failure (HCC)      PLAN:    In order of problems listed above:  CHF: Clinically euvolemic, NYHA functional class II, creatinine has improved since last appointment and is probably at baseline.  Close to estimated dry weight of 250 pounds.  Continue same diuretic regimen (alternating 40 mg / 80 mg doses.  Previous heart failure exacerbation manifested with wheezing (she has a history of normal PFTs a few years ago). HTN: Elevated today even when rechecked, but otherwise has been normal.  She will keep a blood pressure log and send it to us .  No changes made to her medications today. CKD 3: Creatinine is probably at baseline, GFR  40-45. History of gout: No recent gout attacks, on allopurinol. Morbid obesity: Severely obese, close to morbidly obese range.  Weight loss would be beneficial for many of her comorbid conditions. Hypersomnolence: Unclear if she simply wakes up too early in the morning and cannot fall asleep again or if she has true sleep apnea.  STOP-BANG score is actually quite high at 6.  Scheduled for home sleep study.        Medication Adjustments/Labs and Tests Ordered: Current medicines are reviewed at length with the patient today.  Concerns regarding medicines are outlined above.  Orders Placed This Encounter  Procedures   EKG 12-Lead    No orders of the defined types were placed in this encounter.    Patient Instructions  Medication Instructions:  No changes *If you need a refill on your cardiac medications before your next appointment, please call your pharmacy*  Lab Work: None ordered If you have labs (blood work) drawn today and your tests are completely normal, you will receive your results only by: MyChart Message (if you have MyChart) OR A paper copy in the mail If you have any lab test that is abnormal or we need to change your treatment, we will call you to review the results.  Testing/Procedures: WatchPAT?  Is a FDA cleared portable home sleep study test that uses a watch and 3 points of contact to monitor 7 different channels, including your heart rate, oxygen saturations, body position, snoring, and chest motion.  The study is easy to use from the comfort of your own home and accurately detect sleep apnea.  Before bed, you attach the chest sensor, attached the sleep apnea bracelet to your nondominant hand, and attach the finger probe.  After the study, the raw data is downloaded from the watch and scored for apnea events.   For more information: [email or web address]  Patient Testing Instructions:  Do not put battery into the device until bedtime when you  are ready to begin the test. Please call the support number if you need assistance after following the instructions below: 24 hour support line- [phone] or [facility]AMAR support at [phone] (option 2)  Download the [facility] WatchPAT One app through the google play store or App Store  Be sure to turn on or enable access to bluetooth in settlings on your smartphone/ device  Make sure no other bluetooth devices are on and  within the vicinity of your smartphone/ device and WatchPAT watch during testing.  Make sure to leave your smart phone/ device plugged in and charging all night.  When ready for bed:  Follow the instructions step by step in the WatchPAT One App to activate the testing device. For additional instructions, including video instruction, visit the WatchPAT One video on Youtube. You can search for WatchPat One within Youtube (video is 4 minutes and 18 seconds) or enter: https://youtube/[email or web address]?[email or web address] Please note: You will be prompted to enter a Pin to connect via bluetooth when starting the test. The PIN will be assigned to you when you receive the test.  The device is disposable, but it recommended that you retain the device until you receive a call letting you know the study has been received and the results have been interpreted.  We will let you know if the study did not transmit to us  properly after the test is completed. You do not need to call us  to confirm the receipt of the test.  Please complete the test within 48 hours of receiving PIN.   Frequently Asked Questions:  What is Watch Bruna one?  A single use fully disposable home sleep apnea testing device and will not need to be returned after completion.  What are the requirements to use WatchPAT one?  The be able to have a successful watchpat one sleep study, you should have your Watch pat one device, your smart phone, watch pat one app, your PIN number and Internet access What type of phone do I need?  You should have a  smart phone that uses Android 5.1 and above or any Iphone with IOS 10 and above How can I download the WatchPAT one app?  Based on your device type search for WatchPAT one app either in google play for android devices or APP store for Iphone's Where will I get my PIN for the study?  Your PIN will be provided by your physician's office. It is used for authentication and if you lose/forget your PIN, please reach out to your providers office.  I do not have Internet at home. Can I do WatchPAT one study?  WatchPAT One needs Internet connection throughout the night to be able to transmit the sleep data. You can use your home/local internet or your cellular's data package. However, it is always recommended to use home/local Internet. It is estimated that between 20MB-30MB will be used with each study.However, the application will be looking for space in the phone to start the study.  What happens if I lose internet or bluetooth connection?  During the internet disconnection, your phone will not be able to transmit the sleep data. All the data, will be stored in your phone. As soon as the internet connection is back on, the phone will being sending the sleep data. During the bluetooth disconnection, WatchPAT one will not be able to to send the sleep data to your phone. Data will be kept in the WatchPAT one until two devices have bluetooth connection back on. As soon as the connection is back on, WatchPAT one will send the sleep data to the phone.  How long do I need to wear the WatchPAT one?  After you start the study, you should wear the device at least 6 hours.  How far should I keep my phone from the device?  During the night, your phone should be within 15 feet.  What happens if I leave the room for restroom or other  reasons?  Leaving the room for any reason will not cause any problem. As soon as your get back to the room, both devices will reconnect and will continue to send the sleep data. Can I  use my phone during the sleep study?  Yes, you can use your phone as usual during the study. But it is recommended to put your watchpat one on when you are ready to go to bed.  How will I get my study results?  A soon as you completed your study, your sleep data will be sent to the provider. They will then share the results with you when they are ready.    Follow-Up: At [facility], you and your health needs are our priority.  As part of our continuing mission to provide you with exceptional heart care, our providers are all part of one team.  This team includes your primary Cardiologist (physician) and Advanced Practice Providers or APPs (Physician Assistants and Nurse Practitioners) who all work together to provide you with the care you need, when you need it.  Your next appointment:   1 year(s)  Provider:   Jerel Balding, MD    We recommend signing up for the patient portal called MyChart.  Sign up information is provided on this After Visit Summary.  MyChart is used to connect with patients for Virtual Visits (Telemedicine).  Patients are able to view lab/test results, encounter notes, upcoming appointments, etc.  Non-urgent messages can be sent to your provider as well.   To learn more about what you can do with MyChart, go to [email or web address].          Signed, Jerel Balding, MD  01/15/2024 11:11 AM    [facility]

## 2024-01-15 NOTE — Progress Notes (Signed)
 Patient agreement reviewed and signed on 01/15/2024.  WatchPAT issued to patient on 01/15/2024 by Comer LITTIE Ebbs, RN. Patient aware to not open the WatchPAT box until contacted with the activation PIN. Patient profile initialized in CloudPAT on 01/15/2024 by Izetta Ebbs, RN. Device serial number: [id number]  Please list Reason for Call as Advice Only and type WatchPAT issued to patient in the comment box.

## 2024-01-17 DIAGNOSIS — J4550 Severe persistent asthma, uncomplicated: Secondary | ICD-10-CM | POA: Diagnosis not present

## 2024-01-29 ENCOUNTER — Telehealth: Payer: Self-pay | Admitting: Cardiovascular Disease

## 2024-01-29 NOTE — Telephone Encounter (Signed)
 Pt said Dr Francyne ordered her a sleep test at ov on 01/15/24 but she has not heard anything. I don't see any orders in. Please advise

## 2024-01-29 NOTE — Telephone Encounter (Signed)
 Yes, she is supposed to have a WatchPAT and got the instructions at that visit. Can we please order it and schedule?

## 2024-01-30 ENCOUNTER — Other Ambulatory Visit: Payer: Self-pay | Admitting: Emergency Medicine

## 2024-01-30 DIAGNOSIS — R0683 Snoring: Secondary | ICD-10-CM

## 2024-01-30 DIAGNOSIS — R400 Somnolence: Secondary | ICD-10-CM

## 2024-01-30 NOTE — Telephone Encounter (Addendum)
**Note De-Identified Brittany [name] Obfuscation** Ordering provider: Dr Francyne Associated diagnoses: Obesity-E66.01 and CHF-I50.32  WatchPAT PA obtained on 01/30/2024 by Brittany [name], Brittany HERO, LPN. Authorization: Per Alm at [employer] and B, a PA is not required for CPT Code: 04199 ([facility]-HST).  Patient notified of PIN ([id number]) on 01/30/2024 Brittany [name] Notification Method: phone.  Phone note routed to covering staff for follow-up.

## 2024-01-30 NOTE — Progress Notes (Unsigned)
 Pt had OV 01/15/24. She was given watchpat and instructions. Order not placed at visit. Late order entered today.

## 2024-02-01 ENCOUNTER — Encounter (INDEPENDENT_AMBULATORY_CARE_PROVIDER_SITE_OTHER): Payer: Self-pay | Admitting: Cardiology

## 2024-02-01 DIAGNOSIS — G4733 Obstructive sleep apnea (adult) (pediatric): Secondary | ICD-10-CM

## 2024-02-08 ENCOUNTER — Ambulatory Visit: Attending: Cardiovascular Disease

## 2024-02-08 DIAGNOSIS — R0683 Snoring: Secondary | ICD-10-CM

## 2024-02-08 DIAGNOSIS — R400 Somnolence: Secondary | ICD-10-CM

## 2024-02-08 NOTE — Procedures (Signed)
   SLEEP STUDY REPORT Patient Information Study Date: 02/01/2024 Patient Name: Brittany [name] Patient ID: [id number] Birth Date: 1940/10/08 Age: 83 Gender: BMI: 40.0 (W=249 lb, H=5' 6'') Stopbang: 6 Referring Physician: Jerel Balding, MD  TEST DESCRIPTION: Home sleep apnea testing was completed using the WatchPat, a Type 1 device, utilizing peripheral arterial tonometry (PAT), chest movement, actigraphy, pulse oximetry, pulse rate, body position and snore. AHI was calculated with apnea and hypopnea using valid sleep time as the denominator. RDI includes apneas, hypopneas, and RERAs. The data acquired and the scoring of sleep and all associated events were performed in accordance with the recommended standards and specifications as outlined in the AASM Manual for the Scoring of Sleep and Associated Events 2.2.0 (2015).  FINDINGS:  1. Moderate Obstructive Sleep Apnea with AHI 20.3/hr.  2. No Central Sleep Apnea with pAHIc 0.1/hr.  3. Oxygen desaturations as low as 77%.  4. Moderate to severe snoring was present. O2 sats were < 88% for 140.7 min.  5. Total sleep time was 7 hrs and 56 min.  6. 24.9% of total sleep time was spent in REM sleep.  7. Shortened sleep onset latency at  8. Shortened REM sleep onset latency at 33 min.  9. Total awakenings were 2. 10. Arrhythmia detection: None  DIAGNOSIS: Moderate Obstructive Sleep Apnea (G47.33) Nocturnal Hypoxemia  RECOMMENDATIONS: 1. Clinical correlation of these findings is necessary. The decision to treat obstructive sleep apnea (OSA) is usually based on the presence of apnea symptoms or the presence of associated medical conditions such as Hypertension, Congestive Heart Failure, Atrial Fibrillation or Obesity. The most common symptoms of OSA are snoring, gasping for breath while sleeping, daytime sleepiness and fatigue. 2. Initiating apnea therapy is recommended given the presence of symptoms and/or associated  conditions. Recommend proceeding with one of the following:  a. Auto-CPAP therapy with a pressure range of 5-20cm H2O.  b. An oral appliance (OA) that can be obtained from certain dentists with expertise in sleep medicine. These are primarily of use in non-obese patients with mild and moderate disease.  c. An ENT consultation which may be useful to look for specific causes of obstruction and possible treatment options.  d. If patient is intolerant to PAP therapy, consider referral to ENT for evaluation for hypoglossal nerve stimulator. 3. Close follow-up is necessary to ensure success with CPAP or oral appliance therapy for maximum benefit . 4. A follow-up oximetry study on CPAP is recommended to assess the adequacy of therapy and determine the need for supplemental oxygen or the potential need for Bi-level therapy. An arterial blood gas to determine the adequacy of baseline ventilation and oxygenation should also be considered. 5. Healthy sleep recommendations include: adequate nightly sleep (normal 7-9 hrs/night), avoidance of caffeine after noon and alcohol near bedtime, and maintaining a sleep environment that is cool, dark and quiet. 6. Weight loss for overweight patients is recommended. Even modest amounts of weight loss can significantly improve the severity of sleep apnea. 7. Snoring recommendations include: weight loss where appropriate, side sleeping, and avoidance of alcohol before bed. 8. Operation of motor vehicle should not be performed when sleepy.  Signature: Wilbert Bihari, MD; [facility]; Diplomat, American Board of Sleep Medicine Electronically Signed: 02/08/2024 3:11:36 PM

## 2024-02-14 DIAGNOSIS — J4550 Severe persistent asthma, uncomplicated: Secondary | ICD-10-CM | POA: Diagnosis not present

## 2024-02-15 ENCOUNTER — Telehealth: Payer: Self-pay

## 2024-02-15 DIAGNOSIS — I5032 Chronic diastolic (congestive) heart failure: Secondary | ICD-10-CM

## 2024-02-15 DIAGNOSIS — I10 Essential (primary) hypertension: Secondary | ICD-10-CM

## 2024-02-15 DIAGNOSIS — E782 Mixed hyperlipidemia: Secondary | ICD-10-CM

## 2024-02-15 DIAGNOSIS — R400 Somnolence: Secondary | ICD-10-CM

## 2024-02-15 DIAGNOSIS — R0683 Snoring: Secondary | ICD-10-CM

## 2024-02-15 DIAGNOSIS — G4733 Obstructive sleep apnea (adult) (pediatric): Secondary | ICD-10-CM

## 2024-02-15 NOTE — Telephone Encounter (Signed)
-----   Message from Wilbert Bihari sent at 02/08/2024  3:13 PM EDT ----- Please let patient know that they have sleep apnea.  Recommend therapeutic CPAP titration for treatment of patient's sleep disordered breathing.

## 2024-02-15 NOTE — Telephone Encounter (Signed)
 Notified patient of sleep study results and recommendations. All questions were answered and patient verbalized understanding. CPAP Titration ordered today.

## 2024-03-13 DIAGNOSIS — J4550 Severe persistent asthma, uncomplicated: Secondary | ICD-10-CM | POA: Diagnosis not present

## 2024-03-21 DIAGNOSIS — I129 Hypertensive chronic kidney disease with stage 1 through stage 4 chronic kidney disease, or unspecified chronic kidney disease: Secondary | ICD-10-CM | POA: Diagnosis not present

## 2024-03-21 DIAGNOSIS — I130 Hypertensive heart and chronic kidney disease with heart failure and stage 1 through stage 4 chronic kidney disease, or unspecified chronic kidney disease: Secondary | ICD-10-CM | POA: Diagnosis not present

## 2024-03-21 DIAGNOSIS — N1832 Chronic kidney disease, stage 3b: Secondary | ICD-10-CM | POA: Diagnosis not present

## 2024-03-21 DIAGNOSIS — I5032 Chronic diastolic (congestive) heart failure: Secondary | ICD-10-CM | POA: Diagnosis not present

## 2024-03-21 DIAGNOSIS — E782 Mixed hyperlipidemia: Secondary | ICD-10-CM | POA: Diagnosis not present

## 2024-03-25 DIAGNOSIS — Z6841 Body Mass Index (BMI) 40.0 and over, adult: Secondary | ICD-10-CM | POA: Diagnosis not present

## 2024-03-25 DIAGNOSIS — Z01419 Encounter for gynecological examination (general) (routine) without abnormal findings: Secondary | ICD-10-CM | POA: Diagnosis not present

## 2024-03-28 DIAGNOSIS — L501 Idiopathic urticaria: Secondary | ICD-10-CM | POA: Diagnosis not present

## 2024-03-28 DIAGNOSIS — M1A0790 Idiopathic chronic gout, unspecified ankle and foot, without tophus (tophi): Secondary | ICD-10-CM | POA: Diagnosis not present

## 2024-03-28 DIAGNOSIS — E890 Postprocedural hypothyroidism: Secondary | ICD-10-CM | POA: Diagnosis not present

## 2024-03-28 DIAGNOSIS — E782 Mixed hyperlipidemia: Secondary | ICD-10-CM | POA: Diagnosis not present

## 2024-03-28 DIAGNOSIS — M5442 Lumbago with sciatica, left side: Secondary | ICD-10-CM | POA: Diagnosis not present

## 2024-03-28 DIAGNOSIS — N1832 Chronic kidney disease, stage 3b: Secondary | ICD-10-CM | POA: Diagnosis not present

## 2024-03-28 DIAGNOSIS — I129 Hypertensive chronic kidney disease with stage 1 through stage 4 chronic kidney disease, or unspecified chronic kidney disease: Secondary | ICD-10-CM | POA: Diagnosis not present

## 2024-03-28 DIAGNOSIS — Z23 Encounter for immunization: Secondary | ICD-10-CM | POA: Diagnosis not present

## 2024-03-28 DIAGNOSIS — I5032 Chronic diastolic (congestive) heart failure: Secondary | ICD-10-CM | POA: Diagnosis not present

## 2024-03-28 DIAGNOSIS — I130 Hypertensive heart and chronic kidney disease with heart failure and stage 1 through stage 4 chronic kidney disease, or unspecified chronic kidney disease: Secondary | ICD-10-CM | POA: Diagnosis not present

## 2024-03-28 DIAGNOSIS — R600 Localized edema: Secondary | ICD-10-CM | POA: Diagnosis not present

## 2024-04-01 ENCOUNTER — Telehealth: Payer: Self-pay

## 2024-04-01 ENCOUNTER — Ambulatory Visit: Admitting: Cardiovascular Disease

## 2024-04-01 NOTE — Telephone Encounter (Signed)
**Note De-Identified Jedediah Noda Obfuscation** Per the [email or web address] website, a PA is not required for CPT Code: 04188 (CPAP Titration):  I have transferred the order to the sleep lab. I called the pt and made her aware that Medicare does not require a PA for a CPAP Titration and I provided her with the sleep labs phone number so she can call them to be scheduled. She verbalized understanding to all information given and thanked me for my call.

## 2024-04-04 ENCOUNTER — Ambulatory Visit (HOSPITAL_BASED_OUTPATIENT_CLINIC_OR_DEPARTMENT_OTHER): Attending: Cardiology | Admitting: Cardiology

## 2024-04-04 VITALS — Ht 66.5 in | Wt 248.0 lb

## 2024-04-04 DIAGNOSIS — I10 Essential (primary) hypertension: Secondary | ICD-10-CM

## 2024-04-04 DIAGNOSIS — I5032 Chronic diastolic (congestive) heart failure: Secondary | ICD-10-CM | POA: Diagnosis not present

## 2024-04-04 DIAGNOSIS — E782 Mixed hyperlipidemia: Secondary | ICD-10-CM | POA: Diagnosis not present

## 2024-04-04 DIAGNOSIS — Z9981 Dependence on supplemental oxygen: Secondary | ICD-10-CM | POA: Insufficient documentation

## 2024-04-04 DIAGNOSIS — I110 Hypertensive heart disease with heart failure: Secondary | ICD-10-CM | POA: Insufficient documentation

## 2024-04-04 DIAGNOSIS — R0683 Snoring: Secondary | ICD-10-CM

## 2024-04-04 DIAGNOSIS — G4734 Idiopathic sleep related nonobstructive alveolar hypoventilation: Secondary | ICD-10-CM

## 2024-04-04 DIAGNOSIS — G4736 Sleep related hypoventilation in conditions classified elsewhere: Secondary | ICD-10-CM | POA: Insufficient documentation

## 2024-04-04 DIAGNOSIS — G4733 Obstructive sleep apnea (adult) (pediatric): Secondary | ICD-10-CM

## 2024-04-04 DIAGNOSIS — R400 Somnolence: Secondary | ICD-10-CM

## 2024-04-08 NOTE — Procedures (Signed)
  Indications for Polysomnography The patient is a 83 year-old Female who is 5' 6 and weighs 248.0 lbs. Her BMI equals 39.9.  A full night titration treatment study was performed.  MedicationNo Data. Polysomnogram Data A full night polysomnogram recorded the standard physiologic parameters including EEG, EOG, EMG, EKG, nasal and oral airflow.  Respiratory parameters of chest and abdominal movements were recorded with Respiratory Inductance Plethysmography belts.   Oxygen saturation was recorded by pulse oximetry.  Sleep Architecture The total recording time of the polysomnogram was 373.1 minutes.  The total sleep time was 212.0 minutes.  The patient spent 2.1% of total sleep time in Stage N1, 91.5% in Stage N2, 6.4% in Stages N3, and 0.0% in REM.  Sleep latency was 68.3 minutes.   Sleep Efficiency was 56.8%.  Wake after Sleep Onset time was 93.0 minutes.  Titration Summary The patient was titrated at pressures ranging from 5 cm/H20 up to 15 cm/H20 with supplemental oxygen at O2: 2.  The last pressure used in the study was 15 cm/H20 with supplemental oxygen at O2: 2.  Respiratory Events The polysomnogram revealed a presence of 0 obstructive, 2 central, and 0 mixed apneas resulting in an Apnea index of 0.6 events per hour.  There were 33 hypopneas (GreaterEqual to3% desaturation and/or arousal) resulting in an Apnea\Hypopnea Index (AHI  GreaterEqual to3% desaturation and/or arousal) of 9.9 events per hour.  There were 16 hypopneas (GreaterEqual to4% desaturation) resulting in an Apnea\Hypopnea Index (AHI GreaterEqual to4% desaturation) of 5.1 events per hour.  There were - Respiratory  Effort Related Arousals resulting in a RERA index of 0 events per hour. The Respiratory Disturbance Index is 9.9 events per hour.  The snore index was 0 events per hour.  Mean oxygen saturation was 92.3%.  The lowest oxygen saturation during sleep was 85.0%.  Time spent LessEqual to88% oxygen saturation was  minutes  ().  Limb Activity There were 0 limb movements recorded.  Cardiac Summary The average pulse rate was 63.6 bpm.  The minimum pulse rate was 52.0 bpm while the maximum pulse rate was 73.0 bpm.  Cardiac rhythm was normal.  Diagnosis: Obstructive Sleep Apnea Nocturnal Hypoxemia         Recommendations: 1.  Recommend a trial of ResMed CPAP at 15cm H2O with 2L O2 via CPAP, heated humidity and ResMed Airtouch F20 mask. 2. Close follow-up is necessary to ensure success with CPAP therapy for maximum benefit. 3.  A follow-up oximetry study on CPAP is recommended to assess the adequacy of therapy and determine the need for supplemental oxygen or the potential need for Bi-level therapy.  An arterial blood gas to determine the adequacy of baseline ventilation  and oxygenation should also be considered. 4. Healthy sleep recommendations include:  adequate nightly sleep (normal 7-9 hrs/night), avoidance of caffeine after noon and alcohol near bedtime, and maintaining a sleep environment that is cool, dark and quiet. 5. Weight loss for overweight patients is recommended.  Even modest amounts of weight loss can significantly improve the severity of sleep apnea. 6. Snoring recommendations include:  weight loss where appropriate, side sleeping, and avoidance of alcohol before bed. 7. Operation of motor vehicle should be avoided when sleepy.    This study was personally reviewed and electronically signed by: WILBERT BIHARI MD Accredited Board Certified in Sleep Medicine Date/Time: 04/08/2024 3:55PM

## 2024-04-10 DIAGNOSIS — J4550 Severe persistent asthma, uncomplicated: Secondary | ICD-10-CM | POA: Diagnosis not present

## 2024-04-11 ENCOUNTER — Telehealth: Payer: Self-pay | Admitting: *Deleted

## 2024-04-11 DIAGNOSIS — R400 Somnolence: Secondary | ICD-10-CM

## 2024-04-11 DIAGNOSIS — I10 Essential (primary) hypertension: Secondary | ICD-10-CM

## 2024-04-11 DIAGNOSIS — G4733 Obstructive sleep apnea (adult) (pediatric): Secondary | ICD-10-CM

## 2024-04-11 DIAGNOSIS — R0683 Snoring: Secondary | ICD-10-CM

## 2024-04-11 NOTE — Telephone Encounter (Signed)
-----   Message from Wilbert Bihari sent at 04/08/2024  3:56 PM EDT ----- Please let patient know that they had a successful PAP titration and let DME know that orders are in EPIC.  Please set up 6 week OV with me. Needs ONO on CPAP and 2L [place] via CPAP

## 2024-04-11 NOTE — Telephone Encounter (Signed)
 The patient has been notified of the result and verbalized understanding.  All questions (if any) were answered. Joshua Dalton Seip, CMA 04/11/2024 11:51 AM    Upon patient request DME selection is [facility] Patient understands he will be contacted by [facility] to set up his cpap. Patient understands to call if [facility] does not contact him with new setup in a timely manner. Patient understands they will be called once confirmation has been received from [facility] that they have received their new machine to schedule 10 week follow up appointment.   [facility] notified of new cpap order  Please add to airview Patient was grateful for the call and thanked me.

## 2024-04-12 NOTE — Addendum Note (Signed)
 Addended by: SHLOMO WILBERT SAUNDERS on: 04/12/2024 12:47 PM   Modules accepted: Orders

## 2024-04-19 ENCOUNTER — Encounter: Payer: Self-pay | Admitting: Cardiovascular Disease

## 2024-05-08 DIAGNOSIS — J4550 Severe persistent asthma, uncomplicated: Secondary | ICD-10-CM | POA: Diagnosis not present

## 2024-05-09 DIAGNOSIS — G4733 Obstructive sleep apnea (adult) (pediatric): Secondary | ICD-10-CM | POA: Diagnosis not present

## 2024-05-10 DIAGNOSIS — Z79899 Other long term (current) drug therapy: Secondary | ICD-10-CM | POA: Diagnosis not present

## 2024-05-10 DIAGNOSIS — M1A9XX1 Chronic gout, unspecified, with tophus (tophi): Secondary | ICD-10-CM | POA: Diagnosis not present

## 2024-05-10 DIAGNOSIS — M1990 Unspecified osteoarthritis, unspecified site: Secondary | ICD-10-CM | POA: Diagnosis not present

## 2024-05-10 DIAGNOSIS — N1832 Chronic kidney disease, stage 3b: Secondary | ICD-10-CM | POA: Diagnosis not present

## 2024-05-21 ENCOUNTER — Ambulatory Visit: Payer: Self-pay | Admitting: Cardiology

## 2024-05-22 DIAGNOSIS — I10 Essential (primary) hypertension: Secondary | ICD-10-CM | POA: Diagnosis not present

## 2024-05-22 DIAGNOSIS — E890 Postprocedural hypothyroidism: Secondary | ICD-10-CM | POA: Diagnosis not present

## 2024-05-22 DIAGNOSIS — R7309 Other abnormal glucose: Secondary | ICD-10-CM | POA: Diagnosis not present

## 2024-05-22 DIAGNOSIS — G4733 Obstructive sleep apnea (adult) (pediatric): Secondary | ICD-10-CM | POA: Diagnosis not present

## 2024-05-28 DIAGNOSIS — H04123 Dry eye syndrome of bilateral lacrimal glands: Secondary | ICD-10-CM | POA: Diagnosis not present

## 2024-05-28 DIAGNOSIS — Z961 Presence of intraocular lens: Secondary | ICD-10-CM | POA: Diagnosis not present

## 2024-05-28 DIAGNOSIS — H10413 Chronic giant papillary conjunctivitis, bilateral: Secondary | ICD-10-CM | POA: Diagnosis not present

## 2024-05-28 DIAGNOSIS — H52203 Unspecified astigmatism, bilateral: Secondary | ICD-10-CM | POA: Diagnosis not present

## 2024-05-30 DIAGNOSIS — I130 Hypertensive heart and chronic kidney disease with heart failure and stage 1 through stage 4 chronic kidney disease, or unspecified chronic kidney disease: Secondary | ICD-10-CM | POA: Diagnosis not present

## 2024-05-30 DIAGNOSIS — R7309 Other abnormal glucose: Secondary | ICD-10-CM | POA: Diagnosis not present

## 2024-06-05 DIAGNOSIS — J4550 Severe persistent asthma, uncomplicated: Secondary | ICD-10-CM | POA: Diagnosis not present

## 2024-06-11 ENCOUNTER — Telehealth: Payer: Self-pay | Admitting: Cardiovascular Disease

## 2024-06-11 DIAGNOSIS — M545 Low back pain, unspecified: Secondary | ICD-10-CM | POA: Diagnosis not present

## 2024-06-11 MED ORDER — IRBESARTAN 150 MG PO TABS: 225.0000 mg | ORAL_TABLET | Freq: Every day | ORAL | 2 refills | Status: AC

## 2024-06-11 NOTE — Telephone Encounter (Signed)
*  STAT* If patient is at the pharmacy, call can be transferred to refill team.   1. Which medications need to be refilled? (please list name of each medication and dose if known) irbesartan  (AVAPRO ) 150 MG tablet   2. Which pharmacy/location (including street and city if local pharmacy) is medication to be sent to? [id number] - [place], [place] - [address]   3. Do they need a 30 day or 90 day supply? 90

## 2024-06-11 NOTE — Telephone Encounter (Signed)
 Refill sent.

## 2024-06-19 ENCOUNTER — Telehealth: Payer: Self-pay

## 2024-06-19 NOTE — Telephone Encounter (Signed)
 SABRA

## 2024-06-25 ENCOUNTER — Telehealth: Payer: Self-pay | Admitting: *Deleted

## 2024-06-25 ENCOUNTER — Encounter: Payer: Self-pay | Admitting: Cardiology

## 2024-06-25 ENCOUNTER — Ambulatory Visit: Attending: Cardiology | Admitting: Cardiology

## 2024-06-25 VITALS — BP 126/68 | HR 68 | Ht 66.5 in | Wt 244.0 lb

## 2024-06-25 DIAGNOSIS — G4733 Obstructive sleep apnea (adult) (pediatric): Secondary | ICD-10-CM | POA: Diagnosis not present

## 2024-06-25 DIAGNOSIS — I10 Essential (primary) hypertension: Secondary | ICD-10-CM | POA: Diagnosis not present

## 2024-06-25 DIAGNOSIS — R400 Somnolence: Secondary | ICD-10-CM

## 2024-06-25 DIAGNOSIS — I5032 Chronic diastolic (congestive) heart failure: Secondary | ICD-10-CM

## 2024-06-25 DIAGNOSIS — R0683 Snoring: Secondary | ICD-10-CM

## 2024-06-25 NOTE — Progress Notes (Signed)
 Sleep Medicine CONSULT Note    Date:  06/25/2024   ID:  [name], Marcott Apr 05, 1941, MRN [id number]  PCP:  Verdia Lombard, MD  Cardiologist: Jerel Balding, MD   Chief Complaint  Patient presents with   New Patient (Initial Visit)    Obstructive sleep apnea    History of Present Illness:  Brittany [name] is a 83 y.o. female who is being seen today for the evaluation of obstructive sleep apnea at the request of Jerel Balding, MD.  This is a 83 year old female with a history of hyperlipidemia hypertension hypothyroidism and gout.  Dr. Balding follows her for chronic diastolic CHF.  She also has morbid obesity with a dry weight of 250 pounds.  She complained to Dr. Balding at her visit 01/15/2024 that she wakes up early during the night and cannot fall back asleep again. She then would get very sleepy after breakfast and would have to take a nap if she had been out running errands.  He was concerned that this may actually be sleep apnea that was awaking her.  STOP-BANG score was 6.  She underwent home sleep study showing moderate obstructive sleep apnea with an AHI of 20.3/h and nocturnal hypoxemia with O2 saturations less than 88% for 140 minutes.  She underwent CPAP titration 04/04/2024 and was started on ResMed CPAP at 15 cm H2O with 2 L via CPAP.  She is now referred for sleep medicine consultation for treatment of obstructive sleep apnea  She is doing well with her PAP device and thinks that she has gotten used to it.  She tolerates the full face mask but does not like it.  It is fine initially but when she wakes up during the night her mouth is very dry every night. The mask also leaks and makes noises. She has not really had an improvement in her sleep but her mask is keeping her up.   She denies any significant nasal dryness or nasal congestion.  She has noticed an improvement in her fatigue in the am than prior to CPAP.  Past Medical History:  Diagnosis Date    Gout    Hypercholesterolemia    Hypertension    Hypothyroid    OSA on CPAP    moderate obstructive sleep apnea with an AHI of 20.3/h and nocturnal hypoxemia with O2 saturations less than 88% for 140 minutes.On CPAP at 15cm H2O    History reviewed. No pertinent surgical history.  Current Medications: Current Meds  Medication Sig   allopurinol (ZYLOPRIM) 100 MG tablet Take 100 mg by mouth daily.   aspirin EC 81 MG tablet Take 81 mg by mouth daily.   atorvastatin (LIPITOR) 20 MG tablet Take 20 mg by mouth daily.   clonazePAM (KLONOPIN) 0.5 MG tablet Take 0.5 mg by mouth daily as needed.   colchicine 0.6 MG tablet Take 0.6 mg by mouth daily as needed (for gout flare ups).   EPINEPHrine  (EPIPEN  2-PAK) 0.3 mg/0.3 mL IJ SOAJ injection Inject 0.3 mLs (0.3 mg total) into the muscle once.   furosemide  (LASIX ) 40 MG tablet TAKE 1 TABLET (40 MG TOTAL) BY MOUTH DAILY. TAKE 40 MG MONDAY, WEDNESDAY, FRIDAYS. TAKE 80 MG EVERY OTHER DAY.   gabapentin (NEURONTIN) 300 MG capsule Take 1 capsule by mouth daily as needed.   guanFACINE (TENEX) 1 MG tablet Take 2 mg by mouth at bedtime.   irbesartan  (AVAPRO ) 150 MG tablet Take 1.5 tablets (225 mg total) by mouth daily.  omalizumab  (XOLAIR ) 150 MG/ML prefilled syringe See admin instructions.   potassium chloride (KLOR-CON) 10 MEQ tablet Take 10 mEq by mouth daily.   SYNTHROID 125 MCG tablet Take 125 mcg by mouth daily.    Allergies:   Patient has no known allergies.   Social History   Socioeconomic History   Marital status: Married    Spouse name: Not on file   Number of children: Not on file   Years of education: Not on file   Highest education level: Not on file  Occupational History   Not on file  Tobacco Use   Smoking status: Former   Smokeless tobacco: Never  Substance and Sexual Activity   Alcohol use: Never   Drug use: Not on file   Sexual activity: Not on file  Other Topics Concern   Not on file  Social History Narrative   Not on  file   Social Drivers of Health   Financial Resource Strain: Not on file  Food Insecurity: Not on file  Transportation Needs: Not on file  Physical Activity: Not on file  Stress: Not on file  Social Connections: Not on file     Family History:  The patient's family history is not on file.   ROS:  Please see the history of present illness.    ROS All other systems reviewed and are negative.      No data to display             PHYSICAL EXAM:   VS:  BP 126/68   Pulse 68   Ht 5' 6.5 (1.689 m)   Wt 244 lb (110.7 kg)   SpO2 98%   BMI 38.79 kg/m    GEN: Well nourished, well developed, in no acute distress  HEENT: normal  Neck: no JVD, carotid bruits, or masses Cardiac: RRR; no murmurs, rubs, or gallops,no edema.  Intact distal pulses bilaterally.  Respiratory:  clear to auscultation bilaterally, normal work of breathing GI: soft, nontender, nondistended, + BS MS: no deformity or atrophy  Skin: warm and dry, no rash Neuro:  Alert and Oriented x 3, Strength and sensation are intact Psych: euthymic mood, full affect  Wt Readings from Last 3 Encounters:  06/25/24 244 lb (110.7 kg)  04/05/24 248 lb (112.5 kg)  01/15/24 248 lb 9.6 oz (112.8 kg)      Studies/Labs Reviewed:   Home sleep study, CPAP titration, PAP compliance download  Recent Labs: No results found for requested labs within last 365 days.     ASSESSMENT:    1. OSA (obstructive sleep apnea)   2. Essential hypertension      PLAN:  In order of problems listed above:  OSA - The patient is tolerating PAP therapy well without any problems. The PAP download performed by his DME was personally reviewed and interpreted by me today and showed an AHI of 1 /hr on 15 cm H2O with 100% compliance in using more than 4 hours nightly.  The patient has been using and benefiting from PAP use and will continue to benefit from therapy.  -she is on O2 at 2L via CPAP -ONO on CPAP and O2 showed no residual  hypoxemia -she is really struggling with her mask so I will order her an under the nose FFM -I have encouraged her to increase the humidity in her device  Hypertension -BP controlled on exam today - Continue irbesartan  225 mg daily with as needed refills  Followup with me in 3 months  Time Spent: 20 minutes total time of encounter, including 15 minutes spent in face-to-face patient care on the date of this encounter. This time includes coordination of care and counseling regarding above mentioned problem list. Remainder of non-face-to-face time involved reviewing chart documents/testing relevant to the patient encounter and documentation in the medical record. I have independently reviewed documentation from referring provider  Medication Adjustments/Labs and Tests Ordered: Current medicines are reviewed at length with the patient today.  Concerns regarding medicines are outlined above.  Medication changes, Labs and Tests ordered today are listed in the Patient Instructions below.  There are no Patient Instructions on file for this visit.   Signed, Wilbert Bihari, MD  06/25/2024 1:50 PM    [facility] [address] [place], [place], [place]  [address] Phone: [phone]; Fax: [phone]

## 2024-06-25 NOTE — Telephone Encounter (Signed)
Order placed to [facility] via community message.

## 2024-06-25 NOTE — Telephone Encounter (Signed)
-----   Message from Wilbert Bihari sent at 06/25/2024  1:52 PM EST ----- order her an under the nose FFM

## 2024-06-25 NOTE — Patient Instructions (Signed)
 Medication Instructions:  Your physician recommends that you continue on your current medications as directed. Please refer to the Current Medication list given to you today.  *If you need a refill on your cardiac medications before your next appointment, please call your pharmacy*  Lab Work: None If you have labs (blood work) drawn today and your tests are completely normal, you will receive your results only by: MyChart Message (if you have MyChart) OR A paper copy in the mail If you have any lab test that is abnormal or we need to change your treatment, we will call you to review the results.  Testing/Procedures: None  Follow-Up: [facility] you and your health needs are our priority.  As part of our continuing mission to provide you with exceptional heart care, our providers are all part of one team.  This team includes your primary Cardiologist (physician) and Advanced Practice Providers or APPs (Physician Assistants and Nurse Practitioners) who all work together to provide you with the care you need, when you need it.  Your next appointment:   3 month(s)  Provider:   Dr. Shlomo We recommend signing up for the patient portal called MyChart.  Sign up information is provided on this After Visit Summary.  MyChart is used to connect with patients for Virtual Visits (Telemedicine).  Patients are able to view lab/test results, encounter notes, upcoming appointments, etc.  Non-urgent messages can be sent to your provider as well.   To learn more about what you can do with MyChart, go to [email or web address].   Other Instructions None

## 2024-06-26 ENCOUNTER — Ambulatory Visit: Admitting: Cardiology

## 2024-06-27 DIAGNOSIS — M5416 Radiculopathy, lumbar region: Secondary | ICD-10-CM | POA: Diagnosis not present

## 2024-07-03 DIAGNOSIS — J4550 Severe persistent asthma, uncomplicated: Secondary | ICD-10-CM | POA: Diagnosis not present

## 2024-07-24 ENCOUNTER — Other Ambulatory Visit: Payer: Self-pay | Admitting: Internal Medicine

## 2024-07-24 DIAGNOSIS — Z1231 Encounter for screening mammogram for malignant neoplasm of breast: Secondary | ICD-10-CM

## 2024-08-14 ENCOUNTER — Ambulatory Visit

## 2024-08-21 ENCOUNTER — Ambulatory Visit

## 2024-09-02 ENCOUNTER — Ambulatory Visit

## 2024-10-11 ENCOUNTER — Ambulatory Visit: Admitting: Cardiology
